# Patient Record
Sex: Female | Born: 1975 | Race: White | Hispanic: No | Marital: Married | Smoking: Never smoker
Health system: Southern US, Community
[De-identification: ages and names within clinical notes are randomized; demographics above are authoritative.]

## PROBLEM LIST (undated history)

## (undated) DIAGNOSIS — F32A Depression, unspecified: Secondary | ICD-10-CM

## (undated) DIAGNOSIS — N83209 Unspecified ovarian cyst, unspecified side: Secondary | ICD-10-CM

## (undated) DIAGNOSIS — N200 Calculus of kidney: Secondary | ICD-10-CM

## (undated) DIAGNOSIS — N3090 Cystitis, unspecified without hematuria: Secondary | ICD-10-CM

## (undated) DIAGNOSIS — I341 Nonrheumatic mitral (valve) prolapse: Secondary | ICD-10-CM

## (undated) DIAGNOSIS — F329 Major depressive disorder, single episode, unspecified: Secondary | ICD-10-CM

## (undated) DIAGNOSIS — N159 Renal tubulo-interstitial disease, unspecified: Secondary | ICD-10-CM

## (undated) DIAGNOSIS — F419 Anxiety disorder, unspecified: Secondary | ICD-10-CM

## (undated) HISTORY — PX: TUBAL LIGATION: SHX77

## (undated) HISTORY — PX: KNEE SURGERY: SHX244

## (undated) HISTORY — PX: ABDOMINAL SURGERY: SHX537

## (undated) HISTORY — PX: CHOLECYSTECTOMY: SHX55

---

## 2005-05-31 ENCOUNTER — Observation Stay: Payer: Self-pay

## 2005-06-09 ENCOUNTER — Observation Stay: Payer: Self-pay | Admitting: Obstetrics & Gynecology

## 2005-06-09 ENCOUNTER — Emergency Department: Payer: Self-pay | Admitting: Emergency Medicine

## 2005-06-20 ENCOUNTER — Observation Stay: Payer: Self-pay

## 2005-06-29 ENCOUNTER — Observation Stay: Payer: Self-pay | Admitting: Obstetrics & Gynecology

## 2005-07-04 ENCOUNTER — Observation Stay: Payer: Self-pay

## 2005-07-05 ENCOUNTER — Inpatient Hospital Stay: Payer: Self-pay | Admitting: Obstetrics & Gynecology

## 2005-07-12 ENCOUNTER — Observation Stay: Payer: Self-pay

## 2005-07-18 ENCOUNTER — Inpatient Hospital Stay: Payer: Self-pay

## 2005-09-04 ENCOUNTER — Emergency Department: Payer: Self-pay | Admitting: Emergency Medicine

## 2005-09-15 ENCOUNTER — Emergency Department: Payer: Self-pay | Admitting: Emergency Medicine

## 2005-11-02 ENCOUNTER — Ambulatory Visit: Payer: Self-pay | Admitting: Unknown Physician Specialty

## 2005-11-03 ENCOUNTER — Emergency Department: Payer: Self-pay | Admitting: Emergency Medicine

## 2006-01-28 ENCOUNTER — Emergency Department: Payer: Self-pay | Admitting: Emergency Medicine

## 2006-09-23 ENCOUNTER — Emergency Department: Payer: Self-pay | Admitting: Emergency Medicine

## 2006-09-24 ENCOUNTER — Ambulatory Visit: Payer: Self-pay | Admitting: Obstetrics & Gynecology

## 2006-10-26 ENCOUNTER — Emergency Department: Payer: Self-pay | Admitting: Emergency Medicine

## 2006-11-04 ENCOUNTER — Emergency Department: Payer: Self-pay | Admitting: Emergency Medicine

## 2007-02-04 ENCOUNTER — Emergency Department: Payer: Self-pay | Admitting: Emergency Medicine

## 2007-02-08 ENCOUNTER — Emergency Department: Payer: Self-pay | Admitting: Emergency Medicine

## 2007-04-04 ENCOUNTER — Emergency Department: Payer: Self-pay | Admitting: Emergency Medicine

## 2007-04-19 ENCOUNTER — Emergency Department: Payer: Self-pay | Admitting: Emergency Medicine

## 2007-05-07 ENCOUNTER — Emergency Department: Payer: Self-pay | Admitting: Emergency Medicine

## 2007-05-21 ENCOUNTER — Inpatient Hospital Stay (HOSPITAL_COMMUNITY): Admission: AD | Admit: 2007-05-21 | Discharge: 2007-05-22 | Payer: Self-pay | Admitting: Gynecology

## 2007-06-01 ENCOUNTER — Emergency Department: Payer: Self-pay | Admitting: Internal Medicine

## 2007-07-09 ENCOUNTER — Emergency Department: Payer: Self-pay | Admitting: Unknown Physician Specialty

## 2007-09-28 ENCOUNTER — Inpatient Hospital Stay (HOSPITAL_COMMUNITY): Admission: AD | Admit: 2007-09-28 | Discharge: 2007-09-28 | Payer: Self-pay | Admitting: Obstetrics and Gynecology

## 2007-09-28 ENCOUNTER — Ambulatory Visit: Payer: Self-pay | Admitting: Obstetrics and Gynecology

## 2007-10-06 ENCOUNTER — Inpatient Hospital Stay (HOSPITAL_COMMUNITY): Admission: AD | Admit: 2007-10-06 | Discharge: 2007-10-06 | Payer: Self-pay | Admitting: Gynecology

## 2007-10-06 ENCOUNTER — Ambulatory Visit: Payer: Self-pay | Admitting: Obstetrics and Gynecology

## 2008-03-21 ENCOUNTER — Emergency Department: Payer: Self-pay | Admitting: Emergency Medicine

## 2009-01-05 IMAGING — US ABDOMEN ULTRASOUND
1 series · 14 of 25 positions shown · non-contrast
Comparison: none

REASON FOR EXAM: Pregnant, RUQ and RLQ pain
COMMENTS:

[Series 1: abdomen ultrasound · 0.35mm/px · 14 of 46 slices shown]
[im 1/46]
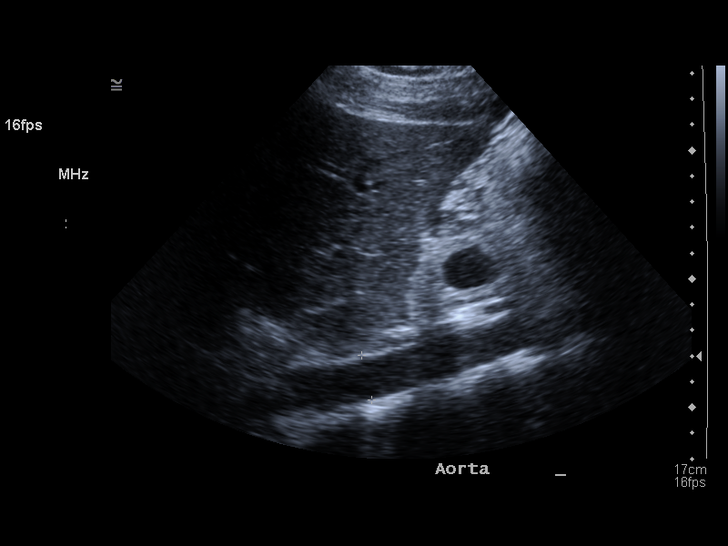
[im 4/46]
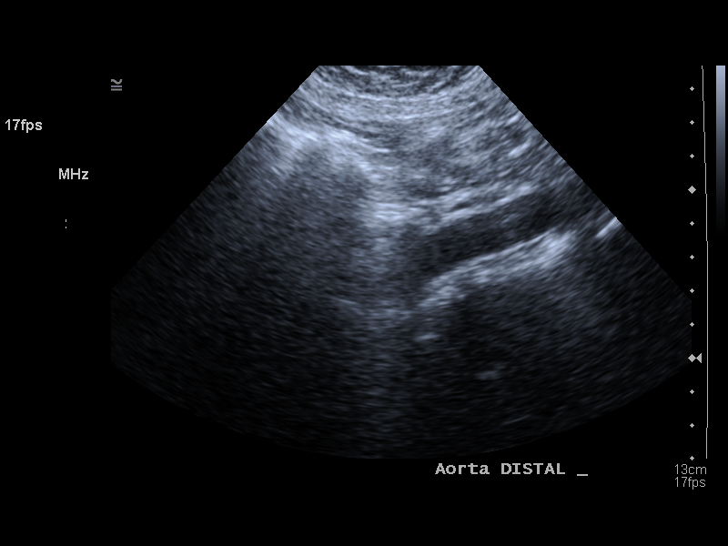
[im 8/46]
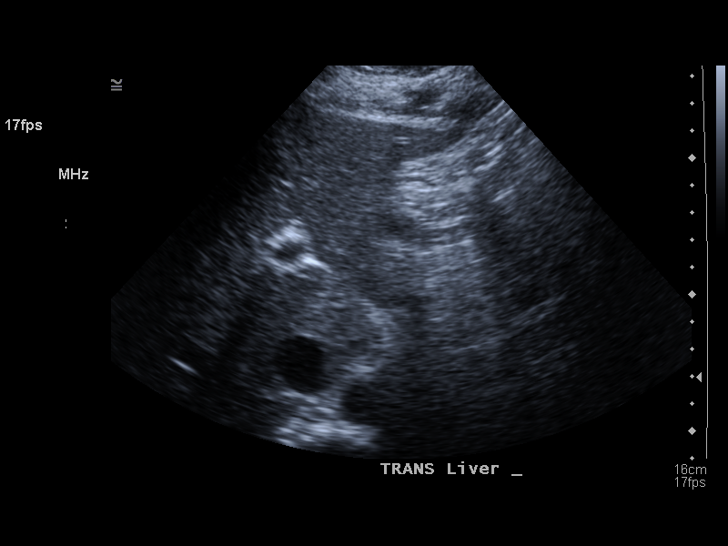
[im 12/46]
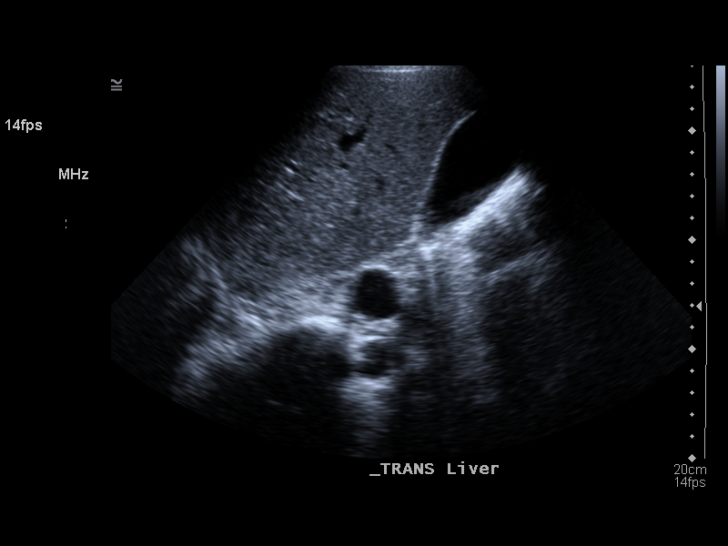
[im 16/46]
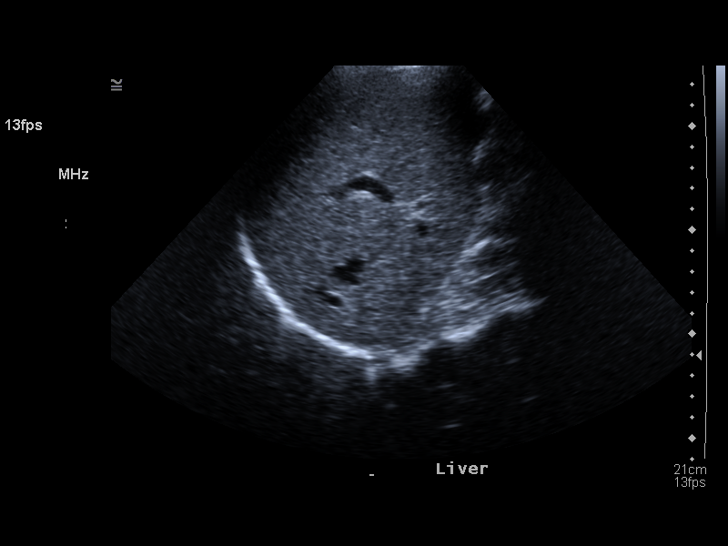
[im 17/46]
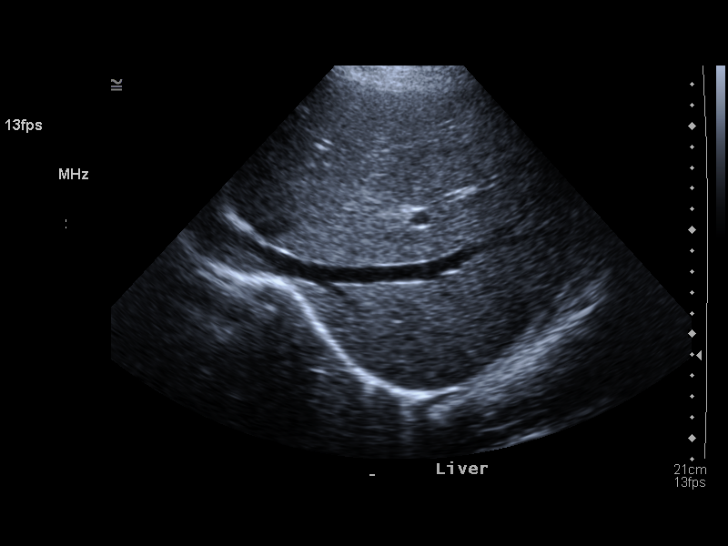
[im 21/46]
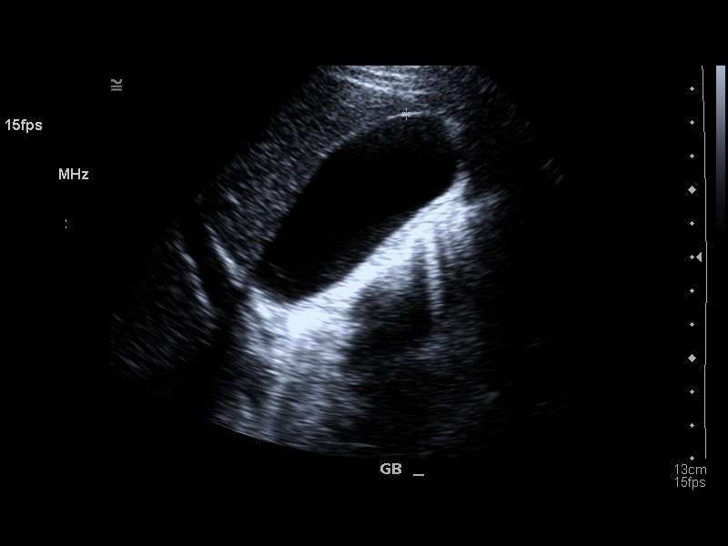
[im 25/46]
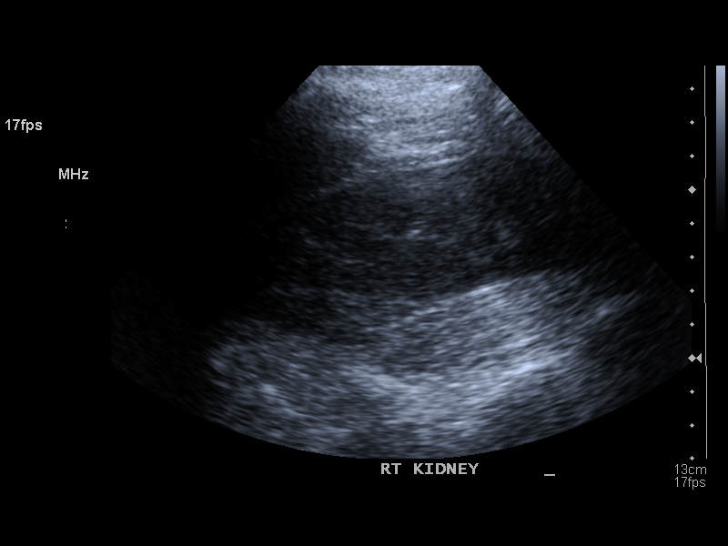
[im 29/46]
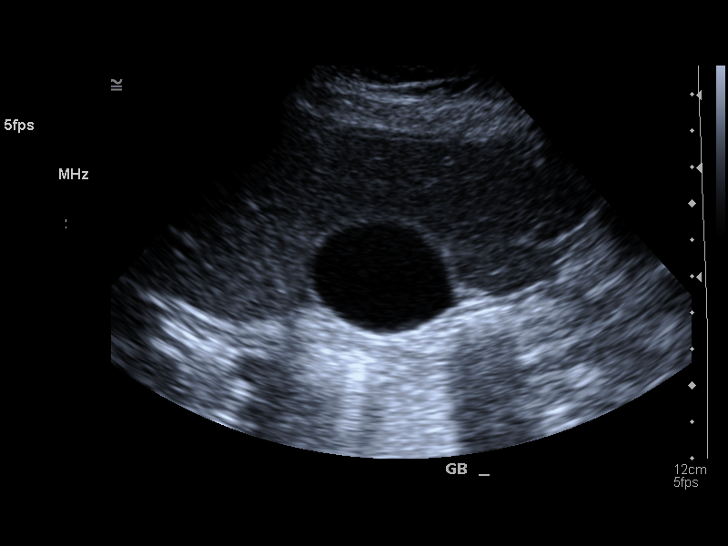
[im 31/46]
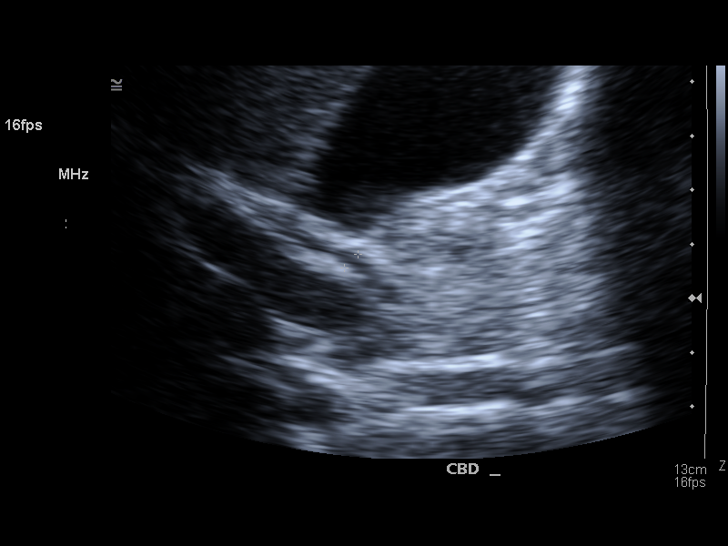
[im 34/46]
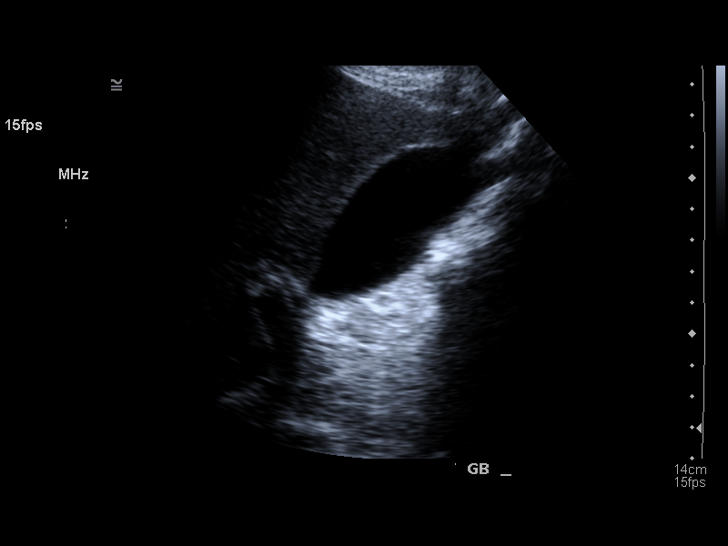
[im 38/46]
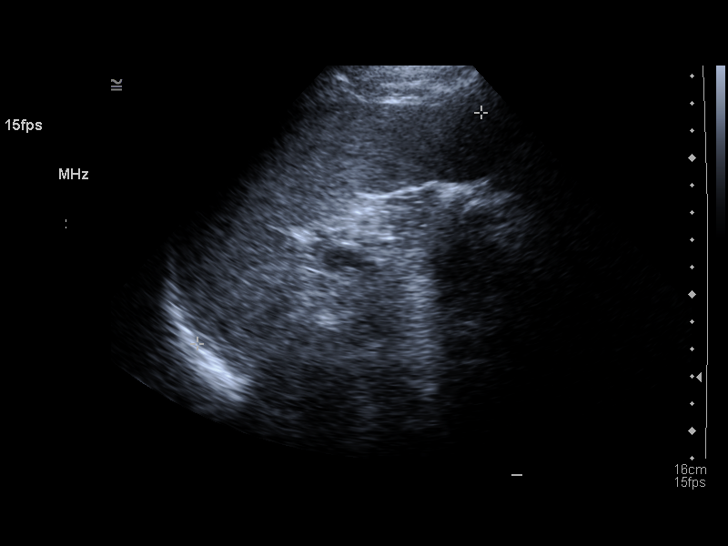
[im 42/46]
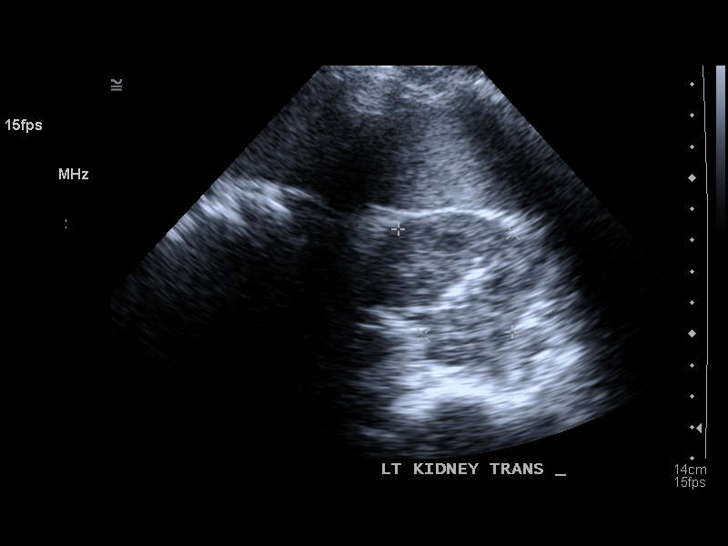
[im 46/46]
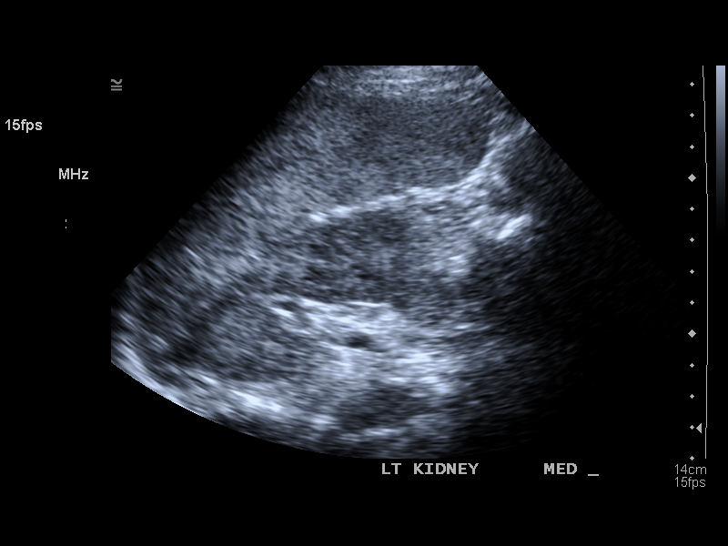

[14 of 25 positions shown; findings below may reference images not displayed]

PROCEDURE:     US  - US ABDOMEN GENERAL SURVEY  - July 09, 2007 [DATE]

RESULT:     The liver and abdominal aorta is normal in appearance. The
spleen is mildly enlarged. The spleen measures 13.4 cm in AP diameter. The
pancreas is not visualized adequately for evaluation.  No gallstones are
seen. There is no thickening of the gallbladder wall. The common bile duct
measures 3.9 mm in diameter. The kidneys show no hydronephrosis. No ascites
is seen. There is noted a possible trace, RIGHT pleural effusion.
IMPRESSION: 1.  No gallstones or other acute change is identified.
2.  No hydronephrosis is seen.
3.  Possible trace, RIGHT pleural effusion.

## 2009-01-05 IMAGING — US US OB US >=[ID] SNGL FETUS
1 series · 14 of 28 positions shown · non-contrast
Comparison: none

REASON FOR EXAM: Abdominal pain, spotting earlier
COMMENTS:

[Series 1: us ob us >=(id) sngl fetus · 0.27mm/px · 14 of 44 slices shown]
[im 2/44]
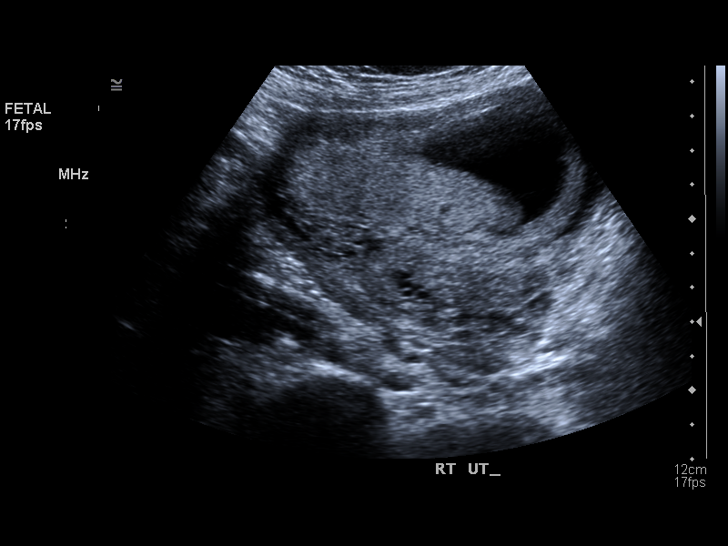
[im 5/44]
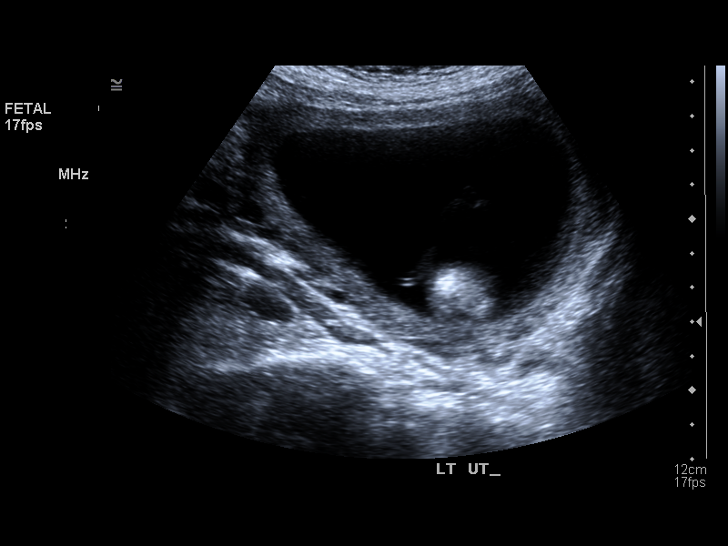
[im 8/44]
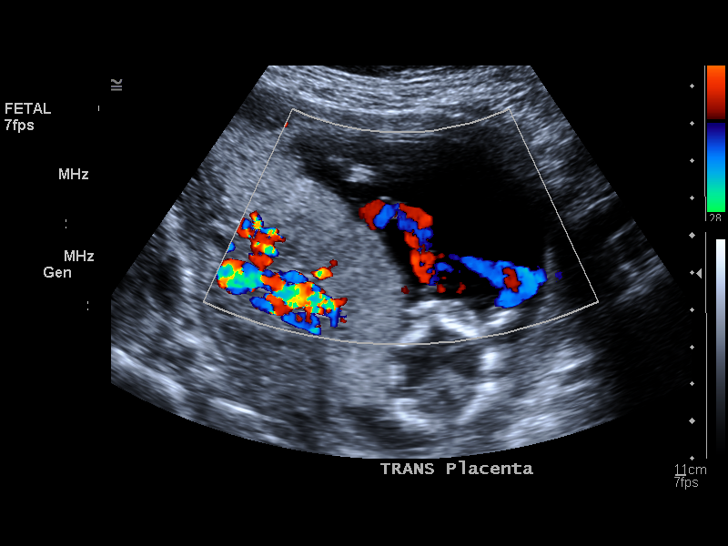
[im 12/44]
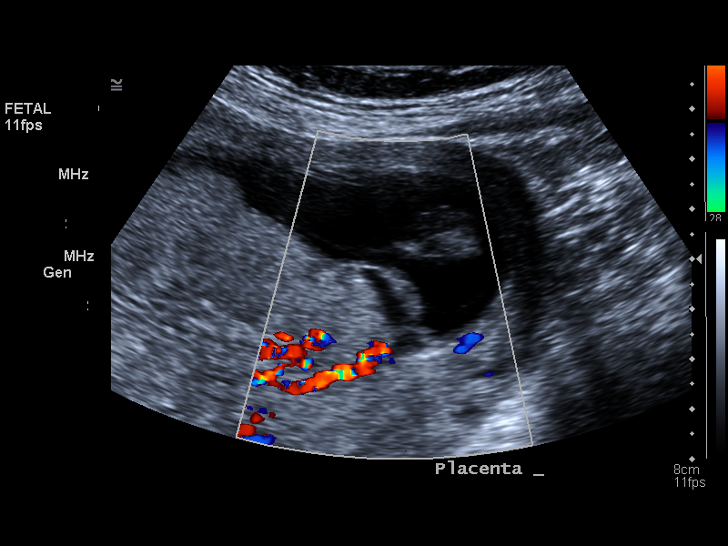
[im 15/44]
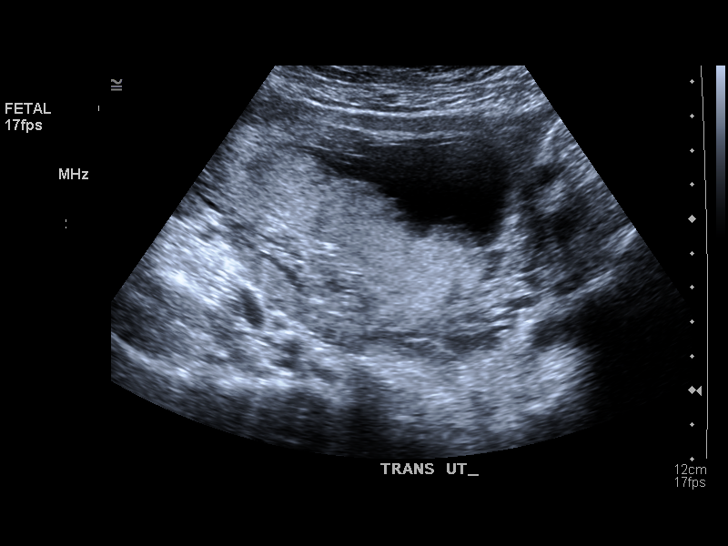
[im 18/44]
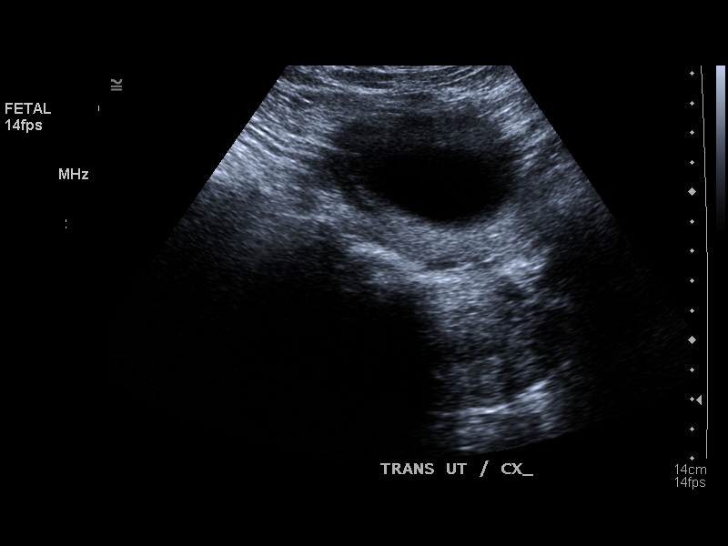
[im 21/44]
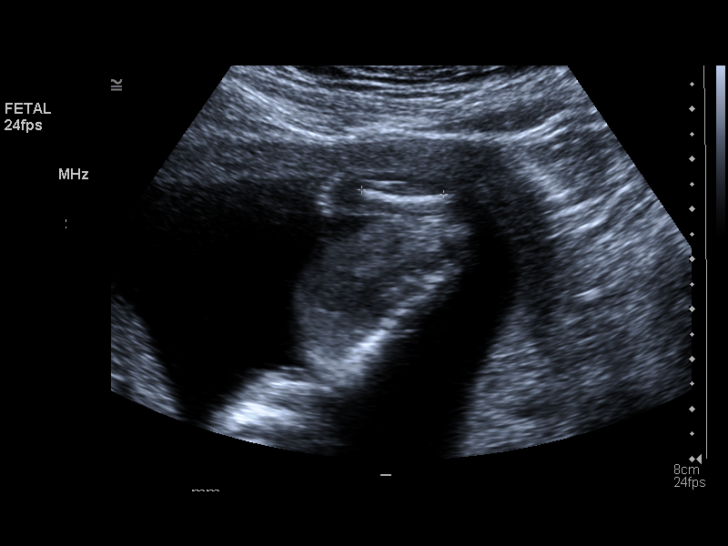
[im 24/44]
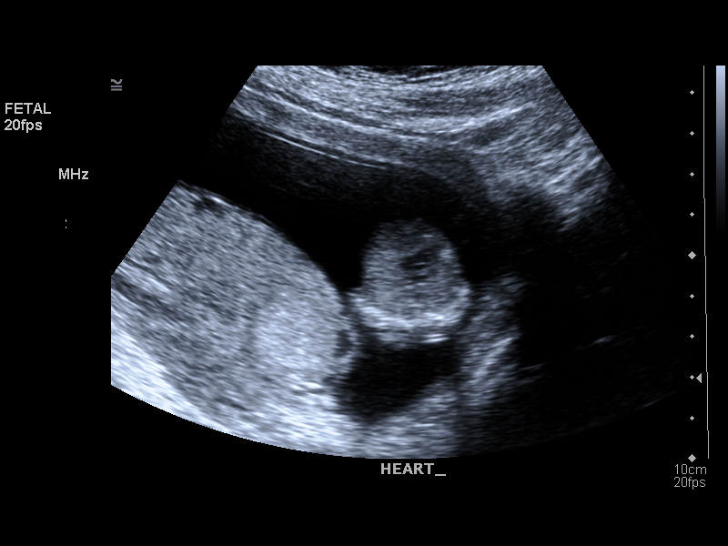
[im 28/44]
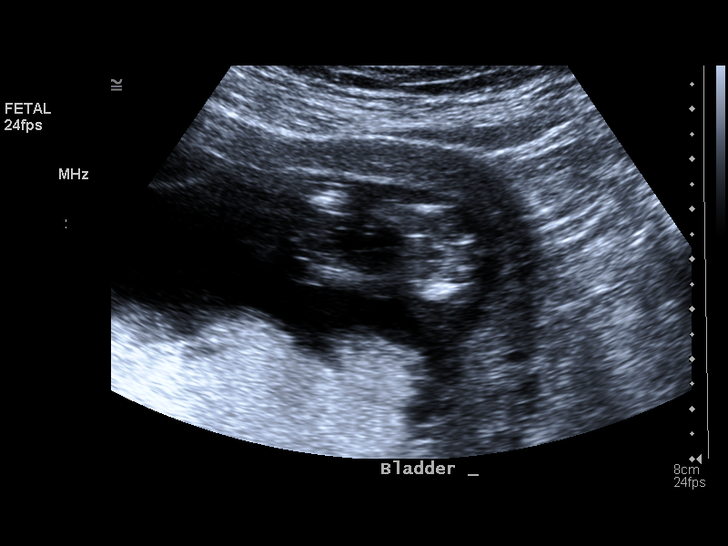
[im 31/44]
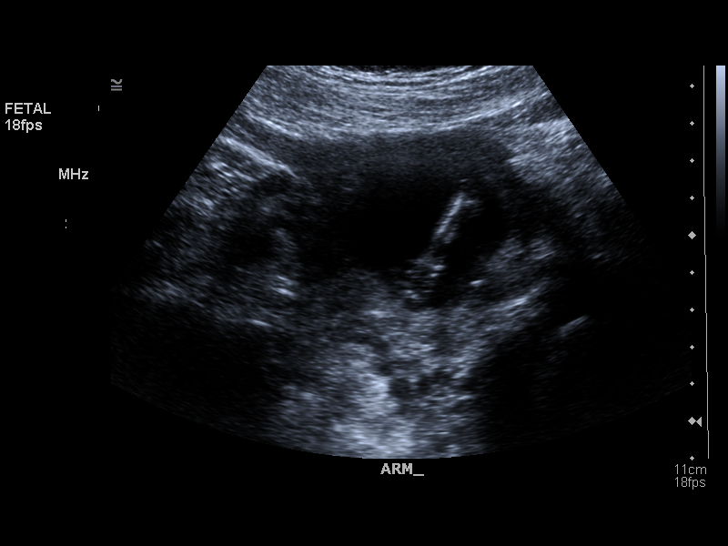
[im 34/44]
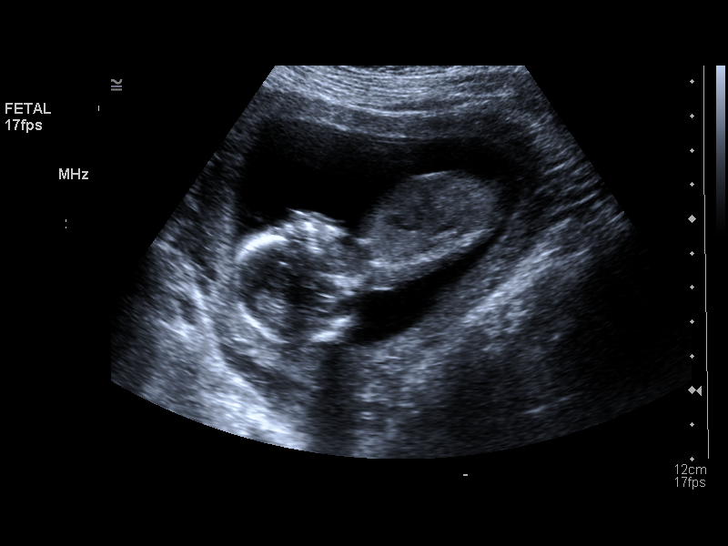
[im 37/44]
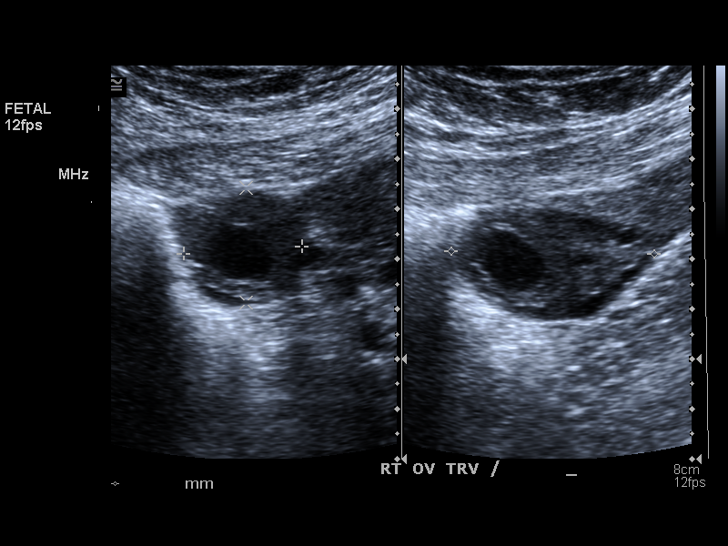
[im 40/44]
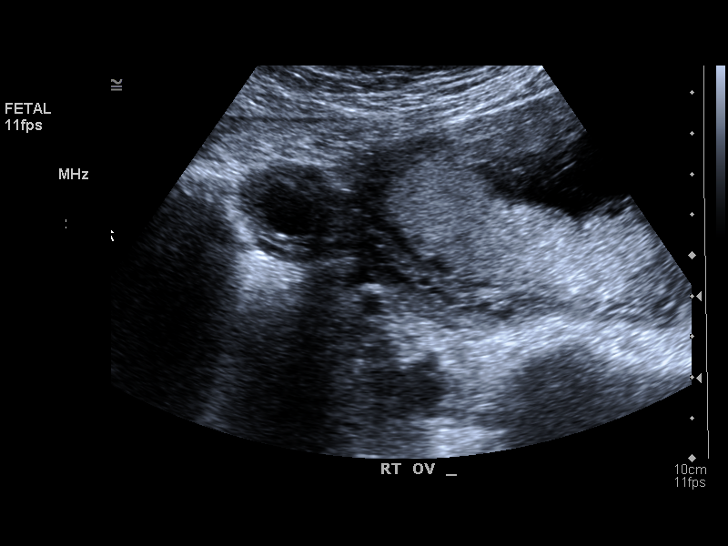
[im 44/44]
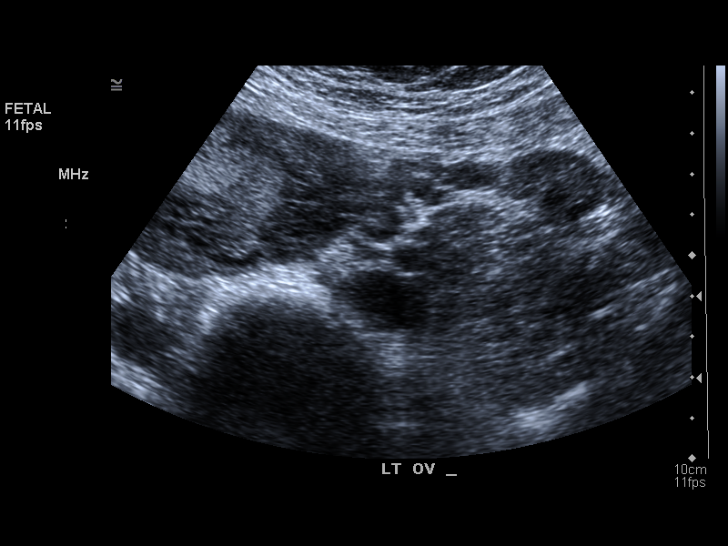

[14 of 28 positions shown; findings below may reference images not displayed]

PROCEDURE:     US  - US OB GREATER/OR EQUAL TO AA4IG  - July 09, 2007 [DATE]

RESULT:     There is single, living intrauterine gestation. The fetal heart
rate was monitored at 144 beats per minute. The placenta is posterior and to
the RIGHT.  A tiny, subchorionic bleed is noted at the inferior tip of the
placenta.  Amniotic fluid volume appears normal. The fetal anatomy is too
small to evaluate sonographically at this time.

Fetal measurements are as follows:

     FL:  16.2 mm (14 weeks, 5 days)
    CRL:   87.0 mm (14 weeks, 4 days)

Average ultrasound age is 14 weeks, 5 days. Ultrasound EDD is 01/02/2008.
IMPRESSION: Please see above.

## 2009-02-16 ENCOUNTER — Ambulatory Visit: Payer: Self-pay | Admitting: Otolaryngology

## 2009-02-18 ENCOUNTER — Emergency Department: Payer: Self-pay | Admitting: Unknown Physician Specialty

## 2009-02-26 ENCOUNTER — Ambulatory Visit: Payer: Self-pay | Admitting: Internal Medicine

## 2009-05-23 ENCOUNTER — Emergency Department: Payer: Self-pay | Admitting: Emergency Medicine

## 2009-07-24 ENCOUNTER — Emergency Department: Payer: Self-pay | Admitting: Unknown Physician Specialty

## 2009-10-18 ENCOUNTER — Emergency Department: Payer: Self-pay | Admitting: Emergency Medicine

## 2009-11-06 ENCOUNTER — Emergency Department: Payer: Self-pay | Admitting: Emergency Medicine

## 2009-11-28 ENCOUNTER — Emergency Department: Payer: Self-pay | Admitting: Emergency Medicine

## 2010-05-16 ENCOUNTER — Emergency Department: Payer: Self-pay | Admitting: Emergency Medicine

## 2011-01-21 IMAGING — CT CT ABD-PELV W/ CM
1 of 2 series · 15 of 32 positions shown, 19 images · IV contrast (isovue)
Comparison: None

REASON FOR EXAM: (1) RLQ abd pain; (2) RLQ pain
COMMENTS:   LMP: neg U-HCG today

PROCEDURE:     CT  - CT ABDOMEN / PELVIS  W  - July 24, 2009  [DATE]
RESULT:     History: Right lower quadrant pain
TECHNIQUE: Multiple axial images of the abdomen and pelvis were performed
from the lung bases to the pubic symphysis, with p.o. contrast and with 100
ml of Isovue 370 intravenous contrast.

[Series 2: appendicitis · axial · 0.72mm/px · z∈[-842,-398]mm · 15 of 162 slices shown, 19 images]
[im 7/162  soft-tissue]
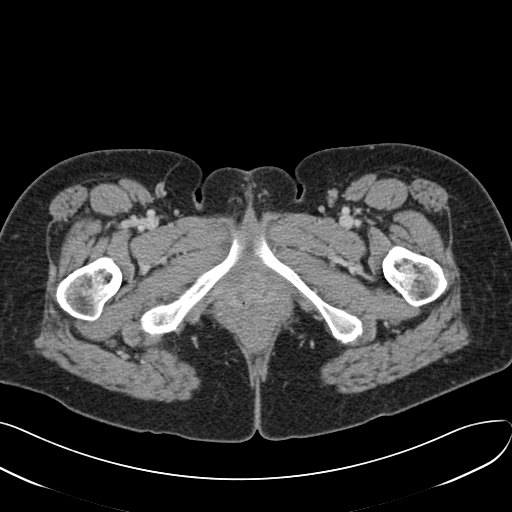
[im 7/162  bone]
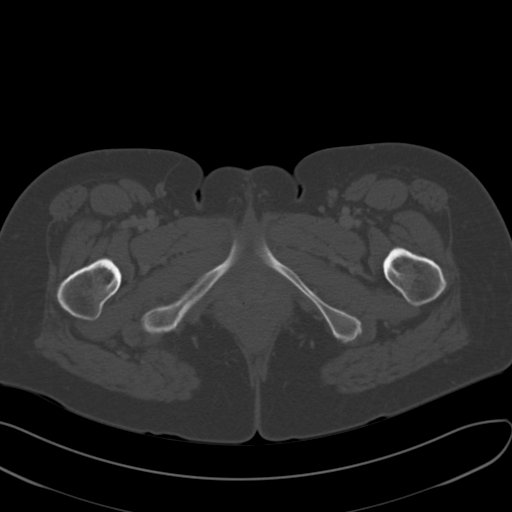
[im 20/162  soft-tissue]
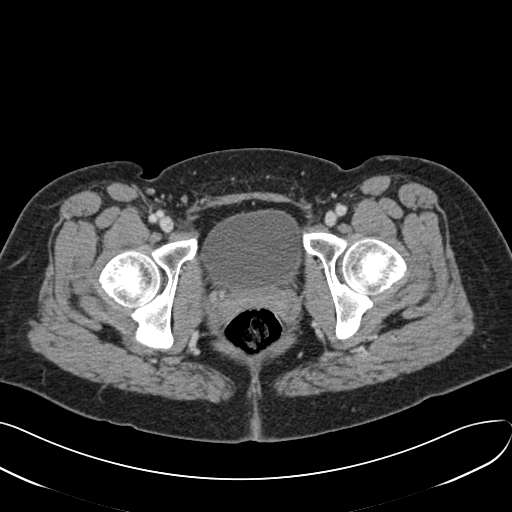
[im 33/162  soft-tissue]
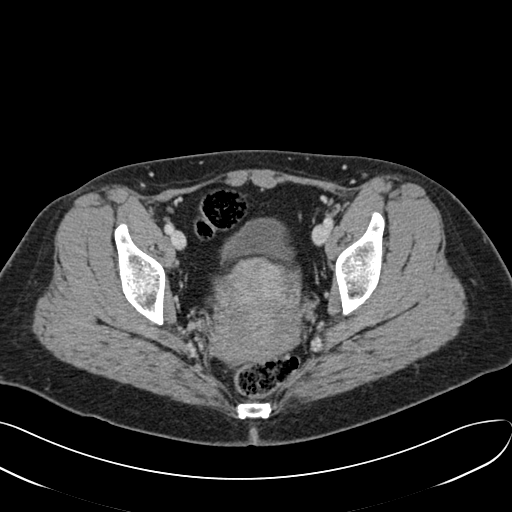
[im 46/162  soft-tissue]
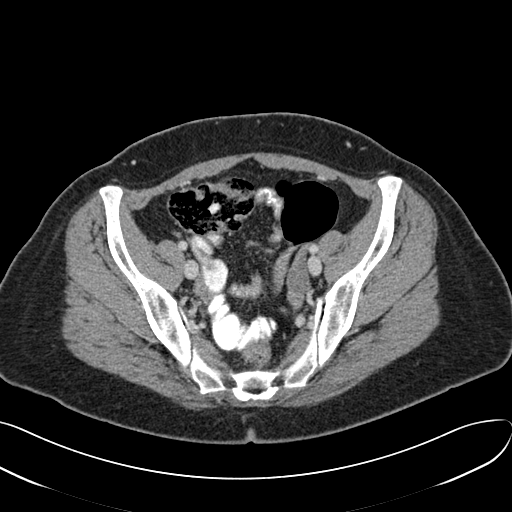
[im 58/162  soft-tissue]
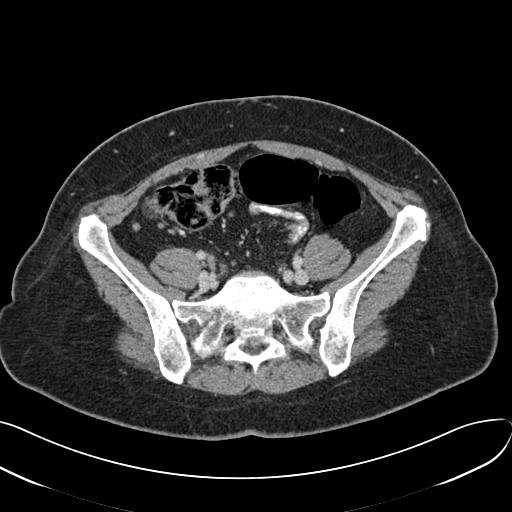
[im 71/162  soft-tissue]
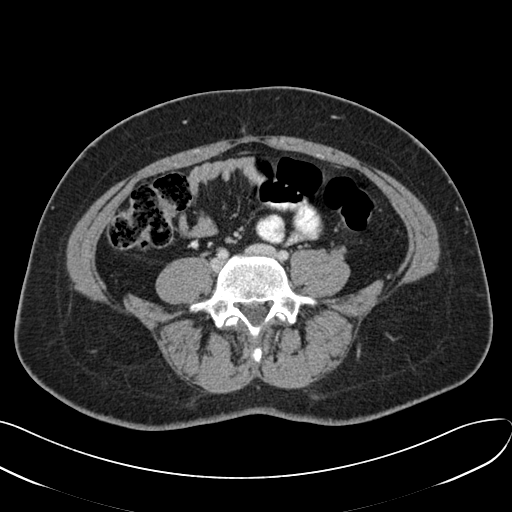
[im 84/162  soft-tissue]
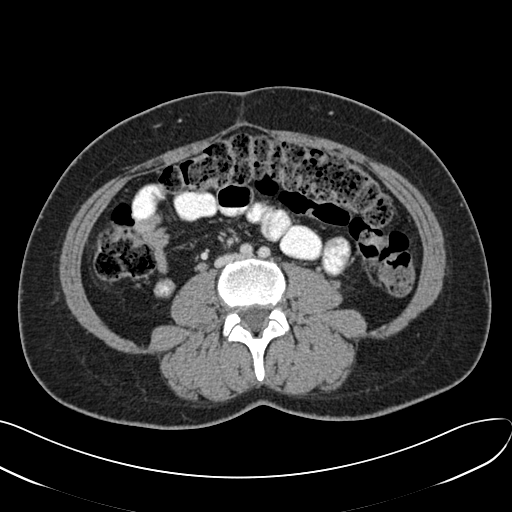
[im 91/162  soft-tissue]
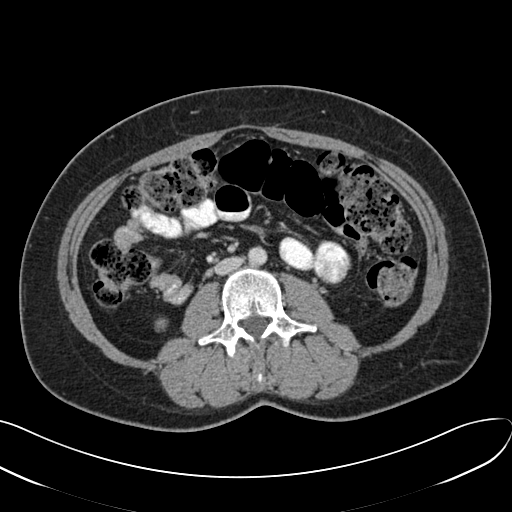
[im 104/162  soft-tissue]
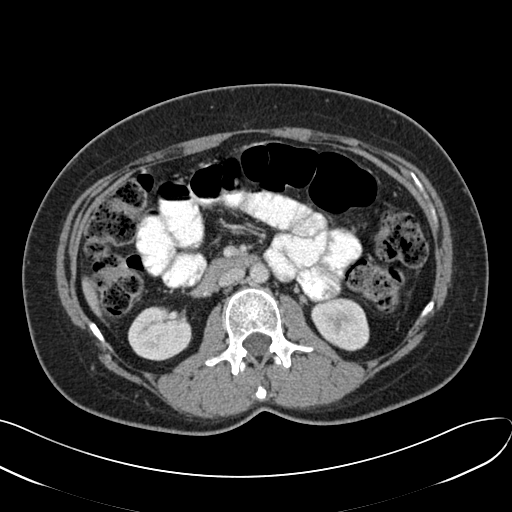
[im 104/162  bone]
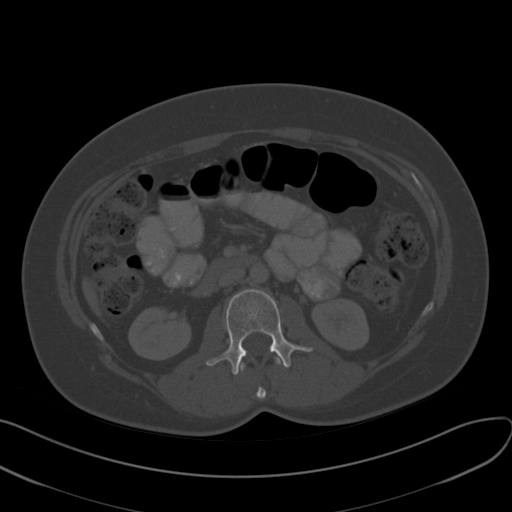
[im 116/162  soft-tissue]
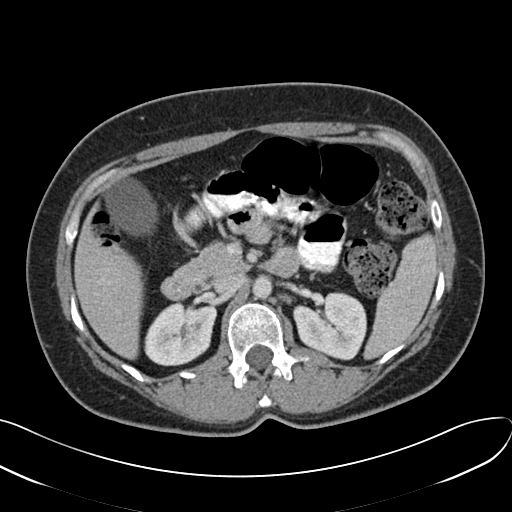
[im 129/162  soft-tissue]
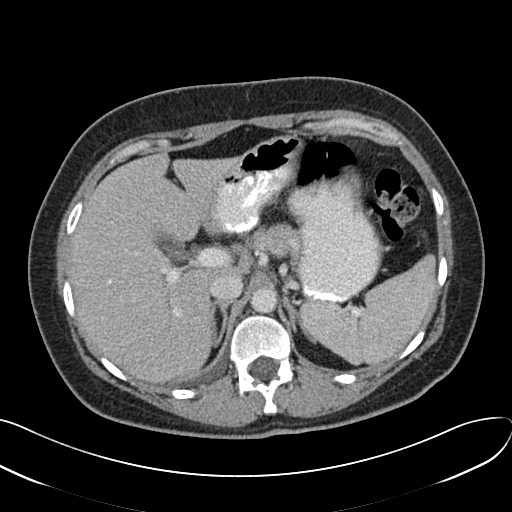
[im 136/162  lung]
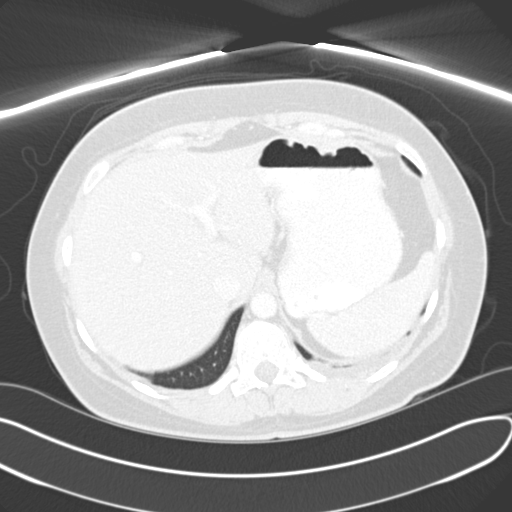
[im 142/162  soft-tissue]
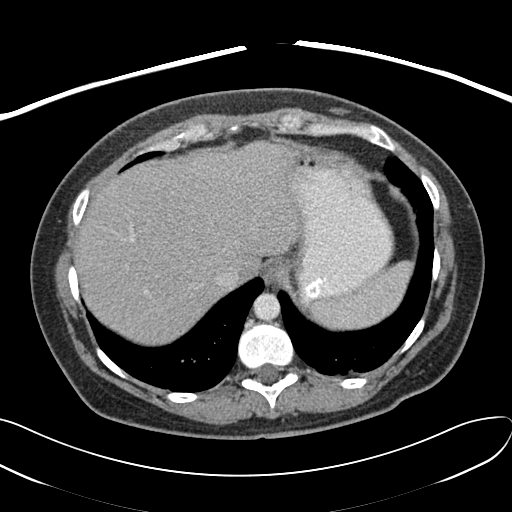
[im 142/162  lung]
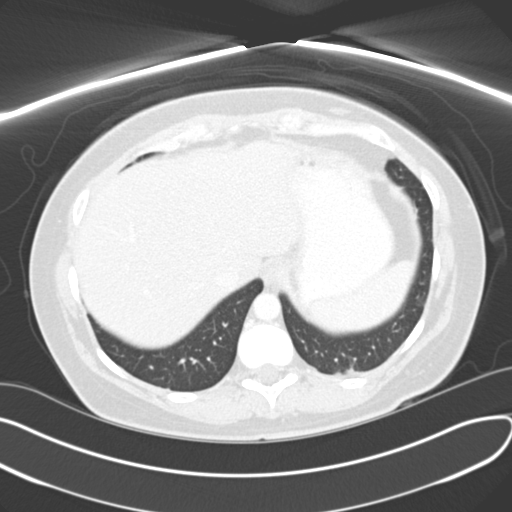
[im 149/162  lung]
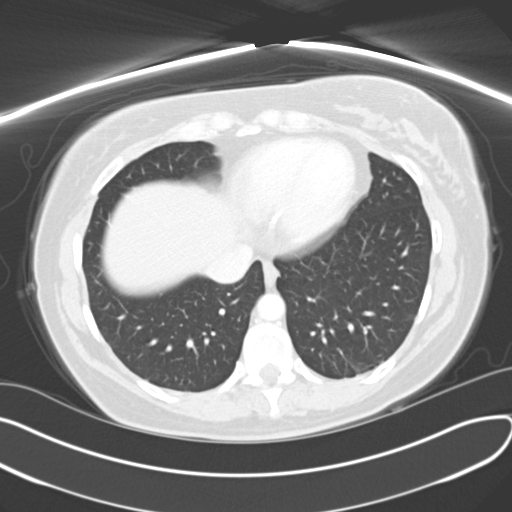
[im 155/162  soft-tissue]
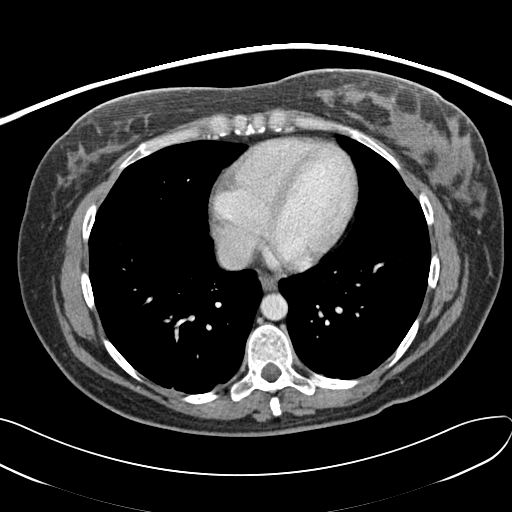
[im 155/162  lung]
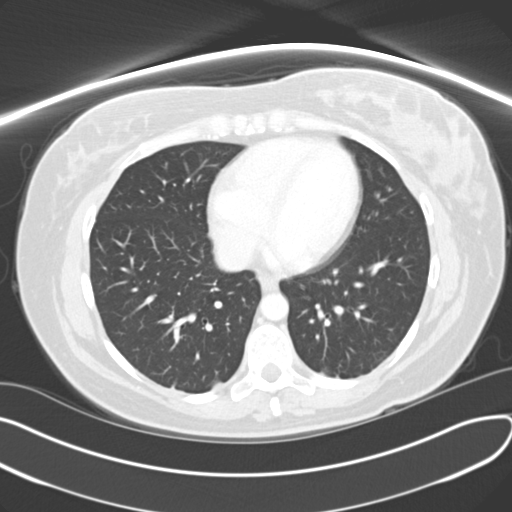

[15 of 32 positions shown; findings below may reference images not displayed]

FINDINGS: The lung bases are clear. There is no pneumothorax. The heart size is
normal.

The liver demonstrates no focal abnormality. There is no intrahepatic or
extrahepatic biliary ductal dilatation. The gallbladder is unremarkable. The
spleen demonstrates no focal abnormality. The kidneys, adrenal glands, and
pancreas are normal. The bladder is unremarkable.

The stomach, duodenum, small intestine, and large intestine demonstrate no
contrast extravasation or dilatation. There is a normal caliber appendix in
the right lower quadrant without periappendiceal inflammatory changes. There
is a moderate amount of stool throughout the colon. There is no
pneumoperitoneum, pneumatosis, or portal venous gas. There is a trace amount
of pelvic free fluid. There is a right ovarian cystic structure measuring
2.7 x 1.8 cm likely representing an ovarian cyst. There is a T type IUD
noted. There is no lymphadenopathy.

The abdominal aorta is normal in caliber with atherosclerosis.

The osseous structures are unremarkable.
IMPRESSION: Normal appendix.

There is a right ovarian cystic structure measuring 2.7 x 1.8 cm likely
representing an ovarian cyst.

There is a T type IUD noted.

## 2011-05-31 ENCOUNTER — Emergency Department: Payer: Self-pay | Admitting: Emergency Medicine

## 2011-06-14 LAB — URINALYSIS, ROUTINE W REFLEX MICROSCOPIC
Glucose, UA: NEGATIVE
Glucose, UA: NEGATIVE
Hgb urine dipstick: NEGATIVE
Hgb urine dipstick: NEGATIVE
Ketones, ur: NEGATIVE
Protein, ur: NEGATIVE
Protein, ur: NEGATIVE
Specific Gravity, Urine: 1.02
Urobilinogen, UA: 1
pH: 6.5

## 2011-06-14 LAB — URINE MICROSCOPIC-ADD ON

## 2011-06-14 LAB — FETAL FIBRONECTIN
Fetal Fibronectin: NEGATIVE
Fetal Fibronectin: NEGATIVE

## 2011-07-06 LAB — URINALYSIS, ROUTINE W REFLEX MICROSCOPIC
Ketones, ur: NEGATIVE
Nitrite: NEGATIVE
Protein, ur: NEGATIVE
Urobilinogen, UA: 0.2

## 2011-07-06 LAB — GC/CHLAMYDIA PROBE AMP, GENITAL: GC Probe Amp, Genital: NEGATIVE

## 2011-07-06 LAB — CBC
HCT: 38.1
Hemoglobin: 13.4
RBC: 4.49
RDW: 13
WBC: 6.5

## 2011-07-06 LAB — ABO/RH
ABO/RH(D): O NEG
Weak D: NEGATIVE

## 2011-07-06 LAB — WET PREP, GENITAL: Trich, Wet Prep: NONE SEEN

## 2011-07-06 LAB — POCT PREGNANCY, URINE
Operator id: 114931
Preg Test, Ur: POSITIVE

## 2011-07-23 ENCOUNTER — Emergency Department (HOSPITAL_COMMUNITY)
Admission: EM | Admit: 2011-07-23 | Discharge: 2011-07-24 | Disposition: A | Discharge status: Home / Self Care | Payer: Medicaid Other | Attending: Emergency Medicine | Admitting: Emergency Medicine

## 2011-07-23 ENCOUNTER — Emergency Department (HOSPITAL_COMMUNITY): Payer: Medicaid Other

## 2011-07-23 DIAGNOSIS — I059 Rheumatic mitral valve disease, unspecified: Secondary | ICD-10-CM | POA: Insufficient documentation

## 2011-07-23 DIAGNOSIS — N739 Female pelvic inflammatory disease, unspecified: Secondary | ICD-10-CM | POA: Insufficient documentation

## 2011-07-23 DIAGNOSIS — J069 Acute upper respiratory infection, unspecified: Secondary | ICD-10-CM | POA: Insufficient documentation

## 2011-07-23 DIAGNOSIS — R1031 Right lower quadrant pain: Secondary | ICD-10-CM | POA: Insufficient documentation

## 2011-07-23 DIAGNOSIS — Z79899 Other long term (current) drug therapy: Secondary | ICD-10-CM | POA: Insufficient documentation

## 2011-07-23 DIAGNOSIS — N83209 Unspecified ovarian cyst, unspecified side: Secondary | ICD-10-CM | POA: Insufficient documentation

## 2011-07-23 LAB — CBC
HCT: 41.8 % (ref 36.0–46.0)
MCHC: 34.2 g/dL (ref 30.0–36.0)
MCV: 83.9 fL (ref 78.0–100.0)
Platelets: 232 10*3/uL (ref 150–400)
RDW: 12.5 % (ref 11.5–15.5)

## 2011-07-23 LAB — URINALYSIS, ROUTINE W REFLEX MICROSCOPIC
Glucose, UA: NEGATIVE mg/dL
Hgb urine dipstick: NEGATIVE
Leukocytes, UA: NEGATIVE
Protein, ur: NEGATIVE mg/dL
Specific Gravity, Urine: 1.004 — ABNORMAL LOW (ref 1.005–1.030)

## 2011-07-23 LAB — WET PREP, GENITAL: Yeast Wet Prep HPF POC: NONE SEEN

## 2011-07-23 LAB — BASIC METABOLIC PANEL
BUN: 5 mg/dL — ABNORMAL LOW (ref 6–23)
Calcium: 9.8 mg/dL (ref 8.4–10.5)
Creatinine, Ser: 0.71 mg/dL (ref 0.50–1.10)
GFR calc Af Amer: >90 mL/min (ref >90)
GFR calc non Af Amer: >90 mL/min (ref >90)

## 2011-07-23 LAB — DIFFERENTIAL
Eosinophils Absolute: 0.1 10*3/uL (ref 0.0–0.7)
Eosinophils Relative: 2 % (ref 0–5)
Lymphocytes Relative: 28 % (ref 12–46)
Lymphs Abs: 2.6 10*3/uL (ref 0.7–4.0)
Monocytes Absolute: 0.6 10*3/uL (ref 0.1–1.0)

## 2011-07-23 LAB — PREGNANCY, URINE: Preg Test, Ur: NEGATIVE

## 2011-07-24 ENCOUNTER — Encounter (HOSPITAL_COMMUNITY): Payer: Self-pay

## 2011-07-24 ENCOUNTER — Emergency Department (HOSPITAL_COMMUNITY): Payer: Medicaid Other

## 2011-07-24 MED ORDER — IOHEXOL 300 MG/ML  SOLN
100.0000 mL | Freq: Once | INTRAMUSCULAR | Status: AC | PRN
  Administered 2011-07-24: 100 mL via INTRAVENOUS

## 2012-01-12 ENCOUNTER — Emergency Department: Payer: Self-pay | Admitting: Internal Medicine

## 2012-01-12 LAB — PREGNANCY, URINE: Pregnancy Test, Urine: NEGATIVE m[IU]/mL

## 2012-02-02 ENCOUNTER — Emergency Department: Payer: Self-pay | Admitting: Emergency Medicine

## 2012-04-02 ENCOUNTER — Emergency Department: Payer: Self-pay | Admitting: Unknown Physician Specialty

## 2012-04-02 LAB — BASIC METABOLIC PANEL
Calcium, Total: 9.5 mg/dL (ref 8.5–10.1)
Chloride: 107 mmol/L (ref 98–107)
Co2: 23 mmol/L (ref 21–32)
EGFR (African American): >60
Glucose: 94 mg/dL (ref 65–99)
Osmolality: 279 (ref 275–301)
Potassium: 3.5 mmol/L (ref 3.5–5.1)
Sodium: 140 mmol/L (ref 136–145)

## 2012-04-02 LAB — CBC
HCT: 46.3 % (ref 35.0–47.0)
HGB: 15.3 g/dL (ref 12.0–16.0)
MCH: 28.6 pg (ref 26.0–34.0)
MCHC: 33 g/dL (ref 32.0–36.0)
MCV: 87 fL (ref 80–100)
RBC: 5.33 10*6/uL — ABNORMAL HIGH (ref 3.80–5.20)

## 2012-04-02 LAB — CK TOTAL AND CKMB (NOT AT ARMC): CK-MB: 0.6 ng/mL (ref 0.5–3.6)

## 2012-04-02 LAB — TROPONIN I: Troponin-I: <0.02 ng/mL

## 2012-04-03 LAB — HEPATIC FUNCTION PANEL A (ARMC)
Albumin: 4.4 g/dL (ref 3.4–5.0)
Alkaline Phosphatase: 72 U/L (ref 50–136)
Bilirubin, Direct: 0.1 mg/dL (ref 0.00–0.20)
SGOT(AST): 30 U/L (ref 15–37)
Total Protein: 8.8 g/dL — ABNORMAL HIGH (ref 6.4–8.2)

## 2012-04-07 ENCOUNTER — Emergency Department: Payer: Self-pay | Admitting: Emergency Medicine

## 2012-04-07 LAB — CBC
HCT: 41.6 % (ref 35.0–47.0)
MCV: 87 fL (ref 80–100)
RBC: 4.79 10*6/uL (ref 3.80–5.20)
RDW: 13.6 % (ref 11.5–14.5)
WBC: 8.4 10*3/uL (ref 3.6–11.0)

## 2012-04-07 LAB — URINALYSIS, COMPLETE
Blood: NEGATIVE
Glucose,UR: >=500 mg/dL (ref 0–75)
Leukocyte Esterase: NEGATIVE
Nitrite: NEGATIVE
Protein: NEGATIVE
RBC,UR: NONE SEEN /HPF (ref 0–5)
Specific Gravity: 1.002 (ref 1.003–1.030)

## 2012-04-07 LAB — PREGNANCY, URINE: Pregnancy Test, Urine: POSITIVE m[IU]/mL

## 2012-04-08 LAB — BASIC METABOLIC PANEL
Anion Gap: 9 (ref 7–16)
BUN: 8 mg/dL (ref 7–18)
Calcium, Total: 9.1 mg/dL (ref 8.5–10.1)
EGFR (Non-African Amer.): >60
Glucose: 121 mg/dL — ABNORMAL HIGH (ref 65–99)
Osmolality: 279 (ref 275–301)
Sodium: 140 mmol/L (ref 136–145)

## 2012-04-08 LAB — HCG, QUANTITATIVE, PREGNANCY: Beta Hcg, Quant.: 54671 m[IU]/mL — ABNORMAL HIGH

## 2013-07-11 IMAGING — CR RIGHT ANKLE - COMPLETE 3+ VIEW
1 series · 5 of 5 positions shown · non-contrast
Comparison: none

REASON FOR EXAM: pain
COMMENTS:   May transport without cardiac monitor

PROCEDURE:     DXR - DXR ANKLE RIGHT COMPLETE  - January 12, 2012  [DATE]
RESULT:     No acute bony or joint abnormality. Type bony density noted just
to the cuboid. This may be a site of old injury.

[Series 1: x ankle ap right · 0.14mm/px · 5 of 5 slices shown]
[im 1/5]
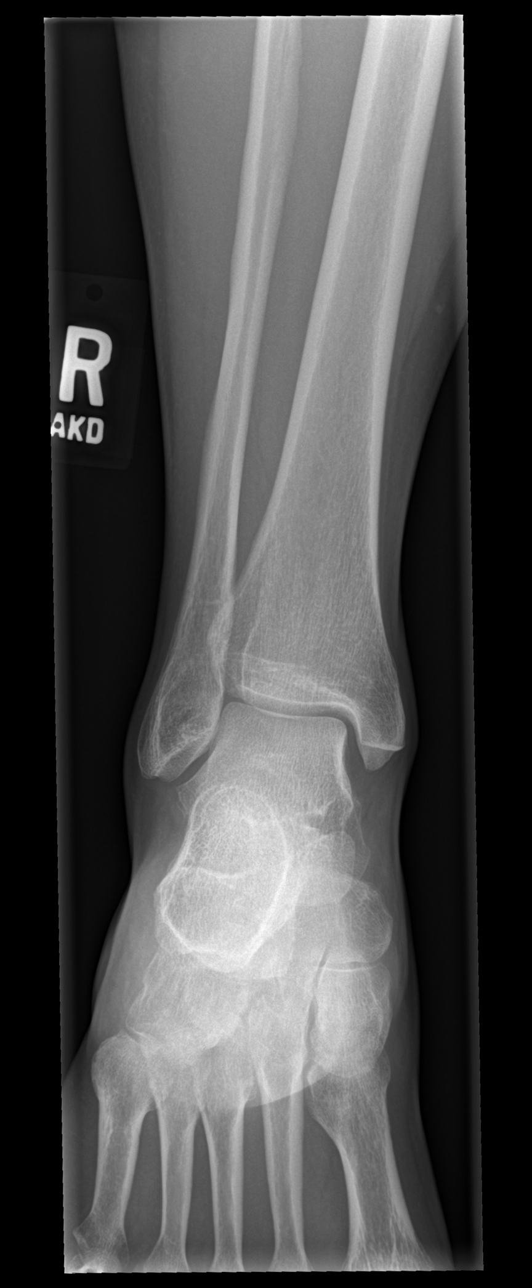
[im 2/5]
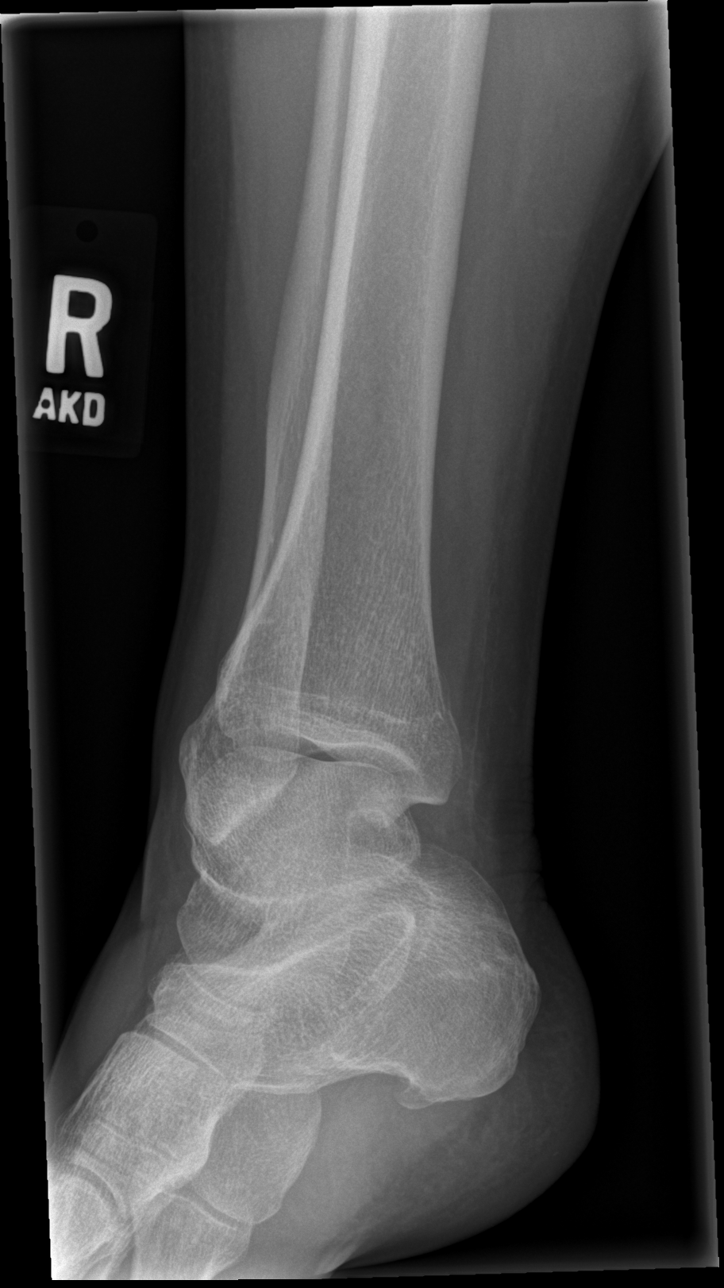
[im 3/5]
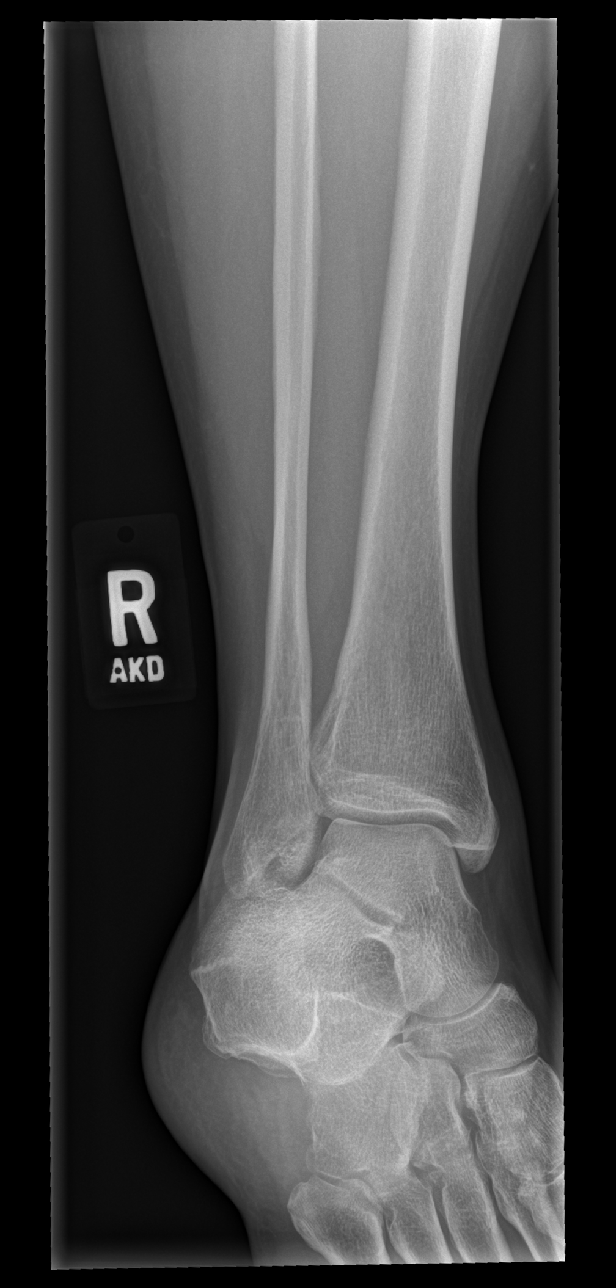
[im 4/5]
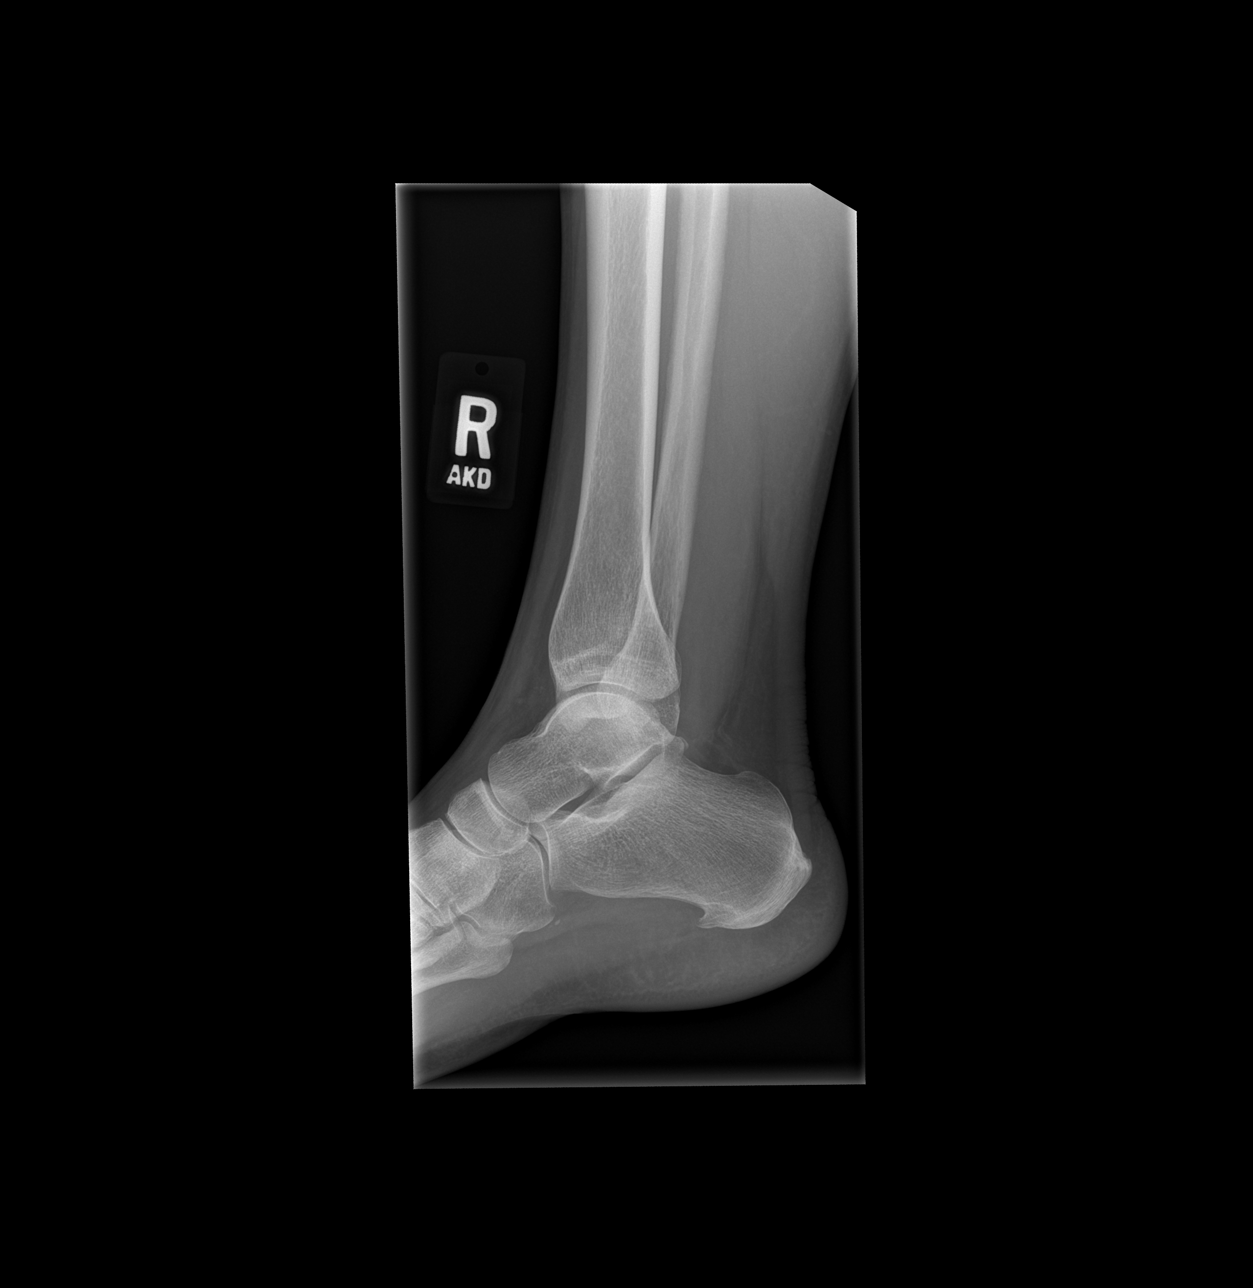
[im 5/5]
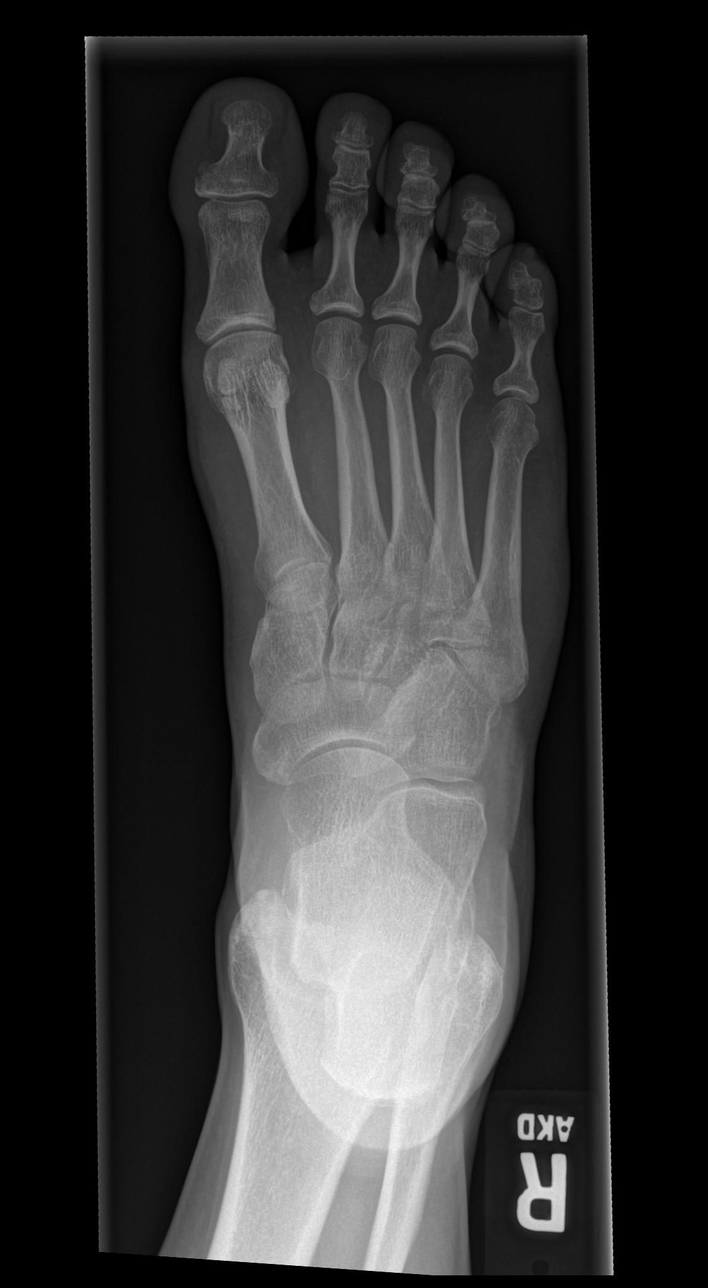

[5 of 5 positions shown; findings below may reference images not displayed]

IMPRESSION: No acute abnormality.

## 2014-06-04 ENCOUNTER — Emergency Department: Payer: Self-pay | Admitting: Emergency Medicine

## 2014-06-04 LAB — COMPREHENSIVE METABOLIC PANEL
ALK PHOS: 72 U/L
ANION GAP: 7 (ref 7–16)
AST: 15 U/L (ref 15–37)
Albumin: 3.8 g/dL (ref 3.4–5.0)
BUN: 7 mg/dL (ref 7–18)
Bilirubin,Total: 0.4 mg/dL (ref 0.2–1.0)
CHLORIDE: 108 mmol/L — AB (ref 98–107)
CO2: 25 mmol/L (ref 21–32)
Calcium, Total: 8.6 mg/dL (ref 8.5–10.1)
Creatinine: 0.89 mg/dL (ref 0.60–1.30)
EGFR (African American): >60
EGFR (Non-African Amer.): >60
Glucose: 116 mg/dL — ABNORMAL HIGH (ref 65–99)
Osmolality: 278 (ref 275–301)
Potassium: 3.6 mmol/L (ref 3.5–5.1)
SGPT (ALT): 14 U/L
Sodium: 140 mmol/L (ref 136–145)
Total Protein: 7.5 g/dL (ref 6.4–8.2)

## 2014-06-04 LAB — URINALYSIS, COMPLETE
BACTERIA: NONE SEEN
BILIRUBIN, UR: NEGATIVE
Blood: NEGATIVE
GLUCOSE, UR: NEGATIVE mg/dL (ref 0–75)
Ketone: NEGATIVE
Leukocyte Esterase: NEGATIVE
NITRITE: NEGATIVE
PH: 6 (ref 4.5–8.0)
Protein: NEGATIVE
Specific Gravity: 1.004 (ref 1.003–1.030)
Squamous Epithelial: 5

## 2014-06-04 LAB — CBC
HCT: 40.8 % (ref 35.0–47.0)
HGB: 13.7 g/dL (ref 12.0–16.0)
MCH: 28.7 pg (ref 26.0–34.0)
MCHC: 33.5 g/dL (ref 32.0–36.0)
MCV: 86 fL (ref 80–100)
Platelet: 206 10*3/uL (ref 150–440)
RBC: 4.76 10*6/uL (ref 3.80–5.20)
RDW: 13.5 % (ref 11.5–14.5)
WBC: 7.8 10*3/uL (ref 3.6–11.0)

## 2014-06-04 LAB — PREGNANCY, URINE: Pregnancy Test, Urine: NEGATIVE m[IU]/mL

## 2014-06-06 LAB — URINE CULTURE

## 2014-07-20 ENCOUNTER — Emergency Department: Payer: Self-pay | Admitting: Emergency Medicine

## 2014-10-29 ENCOUNTER — Emergency Department: Payer: Self-pay | Admitting: Emergency Medicine

## 2014-10-29 LAB — CBC
HCT: 40 % (ref 35.0–47.0)
HGB: 13.5 g/dL (ref 12.0–16.0)
MCH: 28.9 pg (ref 26.0–34.0)
MCHC: 33.9 g/dL (ref 32.0–36.0)
MCV: 85 fL (ref 80–100)
Platelet: 225 10*3/uL (ref 150–440)
RBC: 4.69 10*6/uL (ref 3.80–5.20)
RDW: 12.6 % (ref 11.5–14.5)
WBC: 8 10*3/uL (ref 3.6–11.0)

## 2014-10-29 LAB — BASIC METABOLIC PANEL
ANION GAP: 6 — AB (ref 7–16)
BUN: 9 mg/dL (ref 7–18)
CO2: 27 mmol/L (ref 21–32)
Calcium, Total: 8.3 mg/dL — ABNORMAL LOW (ref 8.5–10.1)
Chloride: 105 mmol/L (ref 98–107)
Creatinine: 0.88 mg/dL (ref 0.60–1.30)
EGFR (Non-African Amer.): >60
Glucose: 122 mg/dL — ABNORMAL HIGH (ref 65–99)
Osmolality: 276 (ref 275–301)
Potassium: 3.4 mmol/L — ABNORMAL LOW (ref 3.5–5.1)
Sodium: 138 mmol/L (ref 136–145)

## 2014-10-29 LAB — TROPONIN I: Troponin-I: <0.02 ng/mL

## 2014-10-30 ENCOUNTER — Emergency Department: Payer: Self-pay | Admitting: Emergency Medicine

## 2014-11-27 ENCOUNTER — Emergency Department: Payer: Self-pay | Admitting: Emergency Medicine

## 2015-01-09 ENCOUNTER — Emergency Department
Admit: 2015-01-09 | Disposition: A | Discharge status: Home / Self Care | Payer: Self-pay | Admitting: Emergency Medicine

## 2015-01-09 LAB — URINALYSIS, COMPLETE
BLOOD: NEGATIVE
Bilirubin,UR: NEGATIVE
Glucose,UR: NEGATIVE mg/dL (ref 0–75)
KETONE: NEGATIVE
Nitrite: NEGATIVE
Ph: 6 (ref 4.5–8.0)
Protein: NEGATIVE
SPECIFIC GRAVITY: 1.014 (ref 1.003–1.030)

## 2015-01-09 LAB — COMPREHENSIVE METABOLIC PANEL
ALK PHOS: 56 U/L
ALT: 15 U/L
AST: 17 U/L
Albumin: 4.3 g/dL
Anion Gap: 9 (ref 7–16)
BUN: 13 mg/dL
Bilirubin,Total: 0.5 mg/dL
CALCIUM: 9.4 mg/dL
CO2: 25 mmol/L
Chloride: 105 mmol/L
Creatinine: 0.8 mg/dL
EGFR (African American): >60
EGFR (Non-African Amer.): >60
Glucose: 112 mg/dL — ABNORMAL HIGH
POTASSIUM: 3.9 mmol/L
Sodium: 139 mmol/L
Total Protein: 7.8 g/dL

## 2015-01-09 LAB — CBC
HCT: 40.7 % (ref 35.0–47.0)
HGB: 13.6 g/dL (ref 12.0–16.0)
MCH: 27.9 pg (ref 26.0–34.0)
MCHC: 33.3 g/dL (ref 32.0–36.0)
MCV: 84 fL (ref 80–100)
Platelet: 205 10*3/uL (ref 150–440)
RBC: 4.86 10*6/uL (ref 3.80–5.20)
RDW: 13.2 % (ref 11.5–14.5)
WBC: 6.3 10*3/uL (ref 3.6–11.0)

## 2015-01-09 LAB — LIPASE, BLOOD: Lipase: 34 U/L

## 2015-03-10 ENCOUNTER — Emergency Department
Admission: EM | Admit: 2015-03-10 | Discharge: 2015-03-10 | Disposition: A | Discharge status: Home / Self Care | Payer: Medicaid Other | Attending: Emergency Medicine | Admitting: Emergency Medicine

## 2015-03-10 ENCOUNTER — Encounter: Payer: Self-pay | Admitting: Emergency Medicine

## 2015-03-10 ENCOUNTER — Emergency Department: Payer: Medicaid Other

## 2015-03-10 DIAGNOSIS — Z3202 Encounter for pregnancy test, result negative: Secondary | ICD-10-CM | POA: Insufficient documentation

## 2015-03-10 DIAGNOSIS — R1032 Left lower quadrant pain: Secondary | ICD-10-CM | POA: Insufficient documentation

## 2015-03-10 HISTORY — DX: Cystitis, unspecified without hematuria: N30.90

## 2015-03-10 HISTORY — DX: Unspecified ovarian cyst, unspecified side: N83.209

## 2015-03-10 HISTORY — DX: Calculus of kidney: N20.0

## 2015-03-10 HISTORY — DX: Renal tubulo-interstitial disease, unspecified: N15.9

## 2015-03-10 LAB — COMPREHENSIVE METABOLIC PANEL
ALBUMIN: 4.3 g/dL (ref 3.5–5.0)
ALT: 12 U/L — ABNORMAL LOW (ref 14–54)
ANION GAP: 5 (ref 5–15)
AST: 15 U/L (ref 15–41)
Alkaline Phosphatase: 59 U/L (ref 38–126)
BUN: 7 mg/dL (ref 6–20)
CALCIUM: 9.1 mg/dL (ref 8.9–10.3)
CO2: 27 mmol/L (ref 22–32)
CREATININE: 0.89 mg/dL (ref 0.44–1.00)
Chloride: 105 mmol/L (ref 101–111)
GFR calc Af Amer: >60 mL/min (ref >60)
Glucose, Bld: 119 mg/dL — ABNORMAL HIGH (ref 65–99)
Potassium: 4 mmol/L (ref 3.5–5.1)
Sodium: 137 mmol/L (ref 135–145)
Total Bilirubin: 0.3 mg/dL (ref 0.3–1.2)
Total Protein: 7.8 g/dL (ref 6.5–8.1)

## 2015-03-10 LAB — URINALYSIS COMPLETE WITH MICROSCOPIC (ARMC ONLY)
Bilirubin Urine: NEGATIVE
Glucose, UA: NEGATIVE mg/dL
Hgb urine dipstick: NEGATIVE
Ketones, ur: NEGATIVE mg/dL
NITRITE: NEGATIVE
PH: 6 (ref 5.0–8.0)
PROTEIN: NEGATIVE mg/dL
Specific Gravity, Urine: 1.015 (ref 1.005–1.030)

## 2015-03-10 LAB — CBC WITH DIFFERENTIAL/PLATELET
BASOS ABS: 0.1 10*3/uL (ref 0–0.1)
BASOS PCT: 1 %
Eosinophils Absolute: 0.1 10*3/uL (ref 0–0.7)
Eosinophils Relative: 2 %
HCT: 41.6 % (ref 35.0–47.0)
Hemoglobin: 14 g/dL (ref 12.0–16.0)
LYMPHS PCT: 35 %
Lymphs Abs: 2 10*3/uL (ref 1.0–3.6)
MCH: 28 pg (ref 26.0–34.0)
MCHC: 33.8 g/dL (ref 32.0–36.0)
MCV: 82.9 fL (ref 80.0–100.0)
Monocytes Absolute: 0.5 10*3/uL (ref 0.2–0.9)
Monocytes Relative: 9 %
NEUTROS ABS: 3.2 10*3/uL (ref 1.4–6.5)
NEUTROS PCT: 53 %
PLATELETS: 222 10*3/uL (ref 150–440)
RBC: 5.01 MIL/uL (ref 3.80–5.20)
RDW: 13.8 % (ref 11.5–14.5)
WBC: 5.9 10*3/uL (ref 3.6–11.0)

## 2015-03-10 LAB — PREGNANCY, URINE: Preg Test, Ur: NEGATIVE

## 2015-03-10 LAB — LIPASE, BLOOD: LIPASE: 37 U/L (ref 22–51)

## 2015-03-10 MED ORDER — MORPHINE SULFATE 4 MG/ML IJ SOLN
INTRAMUSCULAR | Status: AC
  Administered 2015-03-10: 4 mg via INTRAMUSCULAR
  Filled 2015-03-10: qty 1

## 2015-03-10 MED ORDER — ONDANSETRON 4 MG PO TBDP
4.0000 mg | ORAL_TABLET | Freq: Once | ORAL | Status: AC
  Administered 2015-03-10: 4 mg via ORAL

## 2015-03-10 MED ORDER — ONDANSETRON HCL 4 MG PO TABS: 4.0000 mg | ORAL_TABLET | Freq: Three times a day (TID) | ORAL | Status: AC | PRN

## 2015-03-10 MED ORDER — ONDANSETRON 4 MG PO TBDP
ORAL_TABLET | ORAL | Status: AC
  Administered 2015-03-10: 4 mg via ORAL
  Filled 2015-03-10: qty 1

## 2015-03-10 MED ORDER — HYDROCODONE-ACETAMINOPHEN 5-325 MG PO TABS: 1.0000 | ORAL_TABLET | Freq: Four times a day (QID) | ORAL | Status: DC | PRN

## 2015-03-10 MED ORDER — MORPHINE SULFATE 4 MG/ML IJ SOLN
4.0000 mg | Freq: Once | INTRAMUSCULAR | Status: AC
  Administered 2015-03-10: 4 mg via INTRAMUSCULAR

## 2015-03-10 MED ORDER — IBUPROFEN 600 MG PO TABS: 600.0000 mg | ORAL_TABLET | Freq: Three times a day (TID) | ORAL | Status: DC | PRN

## 2015-03-10 NOTE — ED Notes (Signed)
Pt states that she is having BLQ pain but mostly on the left side. She is nauseated but denies Vomiting or diarrhea. Denies any pain while urinating. She states that she has a history of cyst on the ovaries, kidney stones and infections.

## 2015-03-10 NOTE — Discharge Instructions (Signed)
No certain cause was found for your left sided abdominal pain, however your exam and evaluation are reassuring. We discussed holding off on a CT today given risk versus benefit at this point in time.  Return to the emergency department for any new or worsening pain, black or bloody stools, vomiting, fever, or any other symptoms concerning to you.   Abdominal Pain, Women Abdominal (stomach, pelvic, or belly) pain can be caused by many things. It is important to tell your doctor:  The location of the pain.  Does it come and go or is it present all the time?  Are there things that start the pain (eating certain foods, exercise)?  Are there other symptoms associated with the pain (fever, nausea, vomiting, diarrhea)? All of this is helpful to know when trying to find the cause of the pain. CAUSES   Stomach: virus or bacteria infection, or ulcer.  Intestine: appendicitis (inflamed appendix), regional ileitis (Crohn's disease), ulcerative colitis (inflamed colon), irritable bowel syndrome, diverticulitis (inflamed diverticulum of the colon), or cancer of the stomach or intestine.  Gallbladder disease or stones in the gallbladder.  Kidney disease, kidney stones, or infection.  Pancreas infection or cancer.  Fibromyalgia (pain disorder).  Diseases of the female organs:  Uterus: fibroid (non-cancerous) tumors or infection.  Fallopian tubes: infection or tubal pregnancy.  Ovary: cysts or tumors.  Pelvic adhesions (scar tissue).  Endometriosis (uterus lining tissue growing in the pelvis and on the pelvic organs).  Pelvic congestion syndrome (female organs filling up with blood just before the menstrual period).  Pain with the menstrual period.  Pain with ovulation (producing an egg).  Pain with an IUD (intrauterine device, birth control) in the uterus.  Cancer of the female organs.  Functional pain (pain not caused by a disease, may improve without treatment).  Psychological  pain.  Depression. DIAGNOSIS  Your doctor will decide the seriousness of your pain by doing an examination.  Blood tests.  X-rays.  Ultrasound.  CT scan (computed tomography, special type of X-ray).  MRI (magnetic resonance imaging).  Cultures, for infection.  Barium enema (dye inserted in the large intestine, to better view it with X-rays).  Colonoscopy (looking in intestine with a lighted tube).  Laparoscopy (minor surgery, looking in abdomen with a lighted tube).  Major abdominal exploratory surgery (looking in abdomen with a large incision). TREATMENT  The treatment will depend on the cause of the pain.   Many cases can be observed and treated at home.  Over-the-counter medicines recommended by your caregiver.  Prescription medicine.  Antibiotics, for infection.  Birth control pills, for painful periods or for ovulation pain.  Hormone treatment, for endometriosis.  Nerve blocking injections.  Physical therapy.  Antidepressants.  Counseling with a psychologist or psychiatrist.  Minor or major surgery. HOME CARE INSTRUCTIONS   Do not take laxatives, unless directed by your caregiver.  Take over-the-counter pain medicine only if ordered by your caregiver. Do not take aspirin because it can cause an upset stomach or bleeding.  Try a clear liquid diet (broth or water) as ordered by your caregiver. Slowly move to a bland diet, as tolerated, if the pain is related to the stomach or intestine.  Have a thermometer and take your temperature several times a day, and record it.  Bed rest and sleep, if it helps the pain.  Avoid sexual intercourse, if it causes pain.  Avoid stressful situations.  Keep your follow-up appointments and tests, as your caregiver orders.  If the pain does  not go away with medicine or surgery, you may try:  Acupuncture.  Relaxation exercises (yoga, meditation).  Group therapy.  Counseling. SEEK MEDICAL CARE IF:   You  notice certain foods cause stomach pain.  Your home care treatment is not helping your pain.  You need stronger pain medicine.  You want your IUD removed.  You feel faint or lightheaded.  You develop nausea and vomiting.  You develop a rash.  You are having side effects or an allergy to your medicine. SEEK IMMEDIATE MEDICAL CARE IF:   Your pain does not go away or gets worse.  You have a fever.  Your pain is felt only in portions of the abdomen. The right side could possibly be appendicitis. The left lower portion of the abdomen could be colitis or diverticulitis.  You are passing blood in your stools (bright red or black tarry stools, with or without vomiting).  You have blood in your urine.  You develop chills, with or without a fever.  You pass out. MAKE SURE YOU:   Understand these instructions.  Will watch your condition.  Will get help right away if you are not doing well or get worse. Document Released: 07/08/2007 Document Revised: 01/25/2014 Document Reviewed: 07/28/2009 [facility] Patient Information 2015 [place], [place]. This information is not intended to replace advice given to you by your health care provider. Make sure you discuss any questions you have with your health care provider.

## 2015-03-10 NOTE — ED Provider Notes (Signed)
[facility] Emergency Department Provider Note   ____________________________________________  Time seen: 7:30 PM I have reviewed the triage vital signs and the triage nursing note.  HISTORY  Chief Complaint Abdominal Pain   Historian Patient  HPI Suzanne Mitchell is a 39 y.o. female who is complaining of about 2 days of lower abdominal pain on the left side. This feels similar to when she's had ovarian cyst in the past. Pain is been mostly constant. She's had a little bit of frequency of urination but no pain with urination. No right lower quadrant pain. She's had a tubal about 2 years ago. She had a colonoscopy several weeks ago which was normal. She has follow-up with the OB/GYN in a few weeks to consider a hysterectomy due to 10-12 days of heavy vaginal bleeding with each period. No fever, no vomiting, no diarrhea. Some nausea.    Past Medical History  Diagnosis Date  . Kidney stone   . Ovarian cyst   . Bladder infection   . Kidney infection   . Kidney infection     There are no active problems to display for this patient.   Past Surgical History  Procedure Laterality Date  . Abdominal surgery    . Knee surgery    . Tubal ligation      Current Outpatient Rx  Name  Route  Sig  Dispense  Refill  . HYDROcodone-acetaminophen (NORCO/VICODIN) 5-325 MG per tablet   Oral   Take 1 tablet by mouth every 6 (six) hours as needed for moderate pain.   10 tablet   0   . ibuprofen (ADVIL,MOTRIN) 600 MG tablet   Oral   Take 1 tablet (600 mg total) by mouth every 8 (eight) hours as needed.   20 tablet   0   . ondansetron (ZOFRAN) 4 MG tablet   Oral   Take 1 tablet (4 mg total) by mouth every 8 (eight) hours as needed for nausea or vomiting.   10 tablet   1     Allergies Omnipaque; Augmentin; Biaxin; and Keflex  History reviewed. No pertinent family history.  Social History History  Substance Use Topics  . Smoking status: Never Smoker   .  Smokeless tobacco: Not on file  . Alcohol Use: No    Review of Systems  Constitutional: Negative for fever. Eyes: Negative for visual changes. ENT: Negative for sore throat. Cardiovascular: Negative for chest pain. Respiratory: Negative for shortness of breath. Gastrointestinal: Negative for abdominal pain, vomiting and diarrhea. Genitourinary: Negative for dysuria. Musculoskeletal: Negative for back pain. Skin: Negative for rash. Neurological: Negative for headaches, focal weakness or numbness.  ____________________________________________   PHYSICAL EXAM:  VITAL SIGNS: ED Triage Vitals  Enc Vitals Group     BP 03/10/15 1604 121/57 mmHg     Pulse Rate 03/10/15 1604 52     Resp 03/10/15 1604 20     Temp 03/10/15 1604 98.2 F (36.8 C)     Temp Source 03/10/15 1604 Oral     SpO2 03/10/15 1604 98 %     Weight 03/10/15 1604 180 lb (81.647 kg)     Height 03/10/15 1604 5\' 5"  (1.651 m)     Head Cir --      Peak Flow --      Pain Score 03/10/15 1613 5     Pain Loc --      Pain Edu? --      Excl. in GC? --      Constitutional:  Alert and oriented. Well appearing and in no distress. Eyes: Conjunctivae are normal. PERRL. Normal extraocular movements. ENT   Head: Normocephalic and atraumatic.   Nose: No congestion/rhinnorhea.   Mouth/Throat: Mucous membranes are moist.   Neck: No stridor. Cardiovascular: Normal rate, regular rhythm.  No murmurs, rubs, or gallops. Respiratory: Normal respiratory effort without tachypnea nor retractions. Breath sounds are clear and equal bilaterally. No wheezes/rales/rhonchi. Gastrointestinal: Soft. No distention, no guarding, no rebound. Mild to moderate tenderness in the left lower quadrant. Nontender suprapubic upper and right-sided.  Genitourinary/rectal: Deferred Musculoskeletal: Nontender with normal range of motion in all extremities. No joint effusions.  No lower extremity tenderness nor edema. Neurologic:  Normal speech  and language. No gross focal neurologic deficits are appreciated. Skin:  Skin is warm, dry and intact. No rash noted. Psychiatric: Mood and affect are normal. Speech and behavior are normal. Patient exhibits appropriate insight and judgment.  ____________________________________________   EKG I, Governor Rooks, MD, the attending physician have personally viewed and interpreted all ECGs.  None ____________________________________________  LABS (pertinent positives/negatives)  CBC within normal limits Metabolic panel within normal limits Urinalysis trace leukocytes, 0-5 red blood cells and white blood cells with rare bacteria and 6-30 epithelials  ____________________________________________  RADIOLOGY Imaging viewed by me, and interpreted by Radiologist   Ultrasound pelvic and transvaginal:IMPRESSION: Normal ultrasound appearance of the uterus and ovaries. Probable involuting cyst in the right ovary. __________________________________________  PROCEDURES  Procedure(s) performed: None Critical Care performed: None  ____________________________________________   ED COURSE / ASSESSMENT AND PLAN  Pertinent labs & imaging results that were available during my care of the patient were reviewed by me and considered in my medical decision making (see chart for details).  Labs and evaluation are reassuring. Patient deferred pelvic exam at this time stating no vaginal discharge or symptoms, and no exposure to STDs. She does have close follow-up with OB/GYN. Her urinalysis looks like there may be some contamination, but I will send it for a urine culture.  The patient ultrasound is negative to account for source of left sided abdominal pain. Her pain level is down for a 10. She does follow with a GI doctor for history of GI bleeding with history of stomach ulcers, GERD, internal hemorrhoids. She currently takes Carafate and Nexium. She also takes ibuprofen.  We discussed her reassuring  evaluation, and discussed risk versus benefit of CT scan. We decided not to do a CT scan today. She will follow with her OB/GYN. She may also need follow-up with GI. We discussed return precautions, discharge instructions, and follow-up.   ___________________________________________   FINAL CLINICAL IMPRESSION(S) / ED DIAGNOSES   Final diagnoses:  Left lower quadrant pain      Governor Rooks, MD 03/10/15 2312

## 2015-03-10 NOTE — ED Notes (Signed)
Patient reports having abdominal pain for about 2 days states she has a history of ovarian cysts and kidney stones.  States she has taken OTC ibuprofen but has not been effective.

## 2015-03-13 LAB — URINE CULTURE

## 2015-04-09 ENCOUNTER — Encounter: Payer: Self-pay | Admitting: Medical Oncology

## 2015-04-09 ENCOUNTER — Emergency Department
Admission: EM | Admit: 2015-04-09 | Discharge: 2015-04-10 | Disposition: A | Discharge status: Home / Self Care | Payer: Medicaid Other | Attending: Emergency Medicine | Admitting: Emergency Medicine

## 2015-04-09 ENCOUNTER — Emergency Department: Payer: Medicaid Other

## 2015-04-09 ENCOUNTER — Other Ambulatory Visit: Payer: Self-pay

## 2015-04-09 DIAGNOSIS — M25512 Pain in left shoulder: Secondary | ICD-10-CM | POA: Diagnosis not present

## 2015-04-09 DIAGNOSIS — R0789 Other chest pain: Secondary | ICD-10-CM | POA: Insufficient documentation

## 2015-04-09 DIAGNOSIS — Z79899 Other long term (current) drug therapy: Secondary | ICD-10-CM | POA: Diagnosis not present

## 2015-04-09 DIAGNOSIS — Z3202 Encounter for pregnancy test, result negative: Secondary | ICD-10-CM | POA: Insufficient documentation

## 2015-04-09 DIAGNOSIS — R079 Chest pain, unspecified: Secondary | ICD-10-CM | POA: Diagnosis present

## 2015-04-09 HISTORY — DX: Nonrheumatic mitral (valve) prolapse: I34.1

## 2015-04-09 LAB — BASIC METABOLIC PANEL
Anion gap: 9 (ref 5–15)
BUN: 13 mg/dL (ref 6–20)
CALCIUM: 9.3 mg/dL (ref 8.9–10.3)
CO2: 24 mmol/L (ref 22–32)
Chloride: 104 mmol/L (ref 101–111)
Creatinine, Ser: 0.77 mg/dL (ref 0.44–1.00)
GFR calc Af Amer: >60 mL/min (ref >60)
GFR calc non Af Amer: >60 mL/min (ref >60)
GLUCOSE: 116 mg/dL — AB (ref 65–99)
POTASSIUM: 3.6 mmol/L (ref 3.5–5.1)
SODIUM: 137 mmol/L (ref 135–145)

## 2015-04-09 LAB — CBC
HEMATOCRIT: 39.9 % (ref 35.0–47.0)
Hemoglobin: 13.4 g/dL (ref 12.0–16.0)
MCH: 27.7 pg (ref 26.0–34.0)
MCHC: 33.7 g/dL (ref 32.0–36.0)
MCV: 82.2 fL (ref 80.0–100.0)
Platelets: 234 10*3/uL (ref 150–440)
RBC: 4.85 MIL/uL (ref 3.80–5.20)
RDW: 13.6 % (ref 11.5–14.5)
WBC: 8.6 10*3/uL (ref 3.6–11.0)

## 2015-04-09 LAB — TROPONIN I: Troponin I: <0.03 ng/mL (ref <0.031)

## 2015-04-09 MED ORDER — ASPIRIN 81 MG PO CHEW
324.0000 mg | CHEWABLE_TABLET | Freq: Once | ORAL | Status: AC
  Administered 2015-04-09: 324 mg via ORAL
  Filled 2015-04-09: qty 4

## 2015-04-09 NOTE — ED Notes (Signed)
Pt ambulatory to triage with reports of left sided chest pain that initally began last night, lasted 2 hrs and went away. Pain returned around 1700 today worse than last night, pt reports some sob with pain.

## 2015-04-09 NOTE — ED Notes (Signed)
Patient states that she developed some chest pain last night but that she has a history of mitral valve prolapse. Patient reports that tonight she developed sharpe left side chest pain that is radiating to her neck and shoulder. She reports this is different then her normal pain. Patient also reports that she has had some nausea and shortness of breath.

## 2015-04-09 NOTE — Discharge Instructions (Signed)

## 2015-04-09 NOTE — ED Provider Notes (Signed)
[facility] Emergency Department Provider Note  ____________________________________________  Time seen: Approximately 9:02 PM  I have reviewed the triage vital signs and the nursing notes.   HISTORY  Chief Complaint Chest Pain    HPI Suzanne Mitchell is a 39 y.o. 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  He states that she first felt that last night and did not think too much of it.  It was mild and lasted about 2 hours and located in the left side of her anterior chest.  It then completely resolved.  Today it started back up again, moderate severity this time, and accompanied with mild nausea and some mild shortness of breath.  It is still present although now it is a sharp pain in her left shoulder and she does not feel the pressure.  She has not had this type of pain in the past.  She has a cardiologist at [facility].She takes no exogenous estrogen, has not been a long trip recently nor had surgery, has never had any blood clots in her legs nor her lungs.   Past Medical History  Diagnosis Date  . Kidney stone   . Ovarian cyst   . Bladder infection   . Kidney infection   . Kidney infection   . Mitral valve prolapse     There are no active problems to display for this patient.   Past Surgical History  Procedure Laterality Date  . Abdominal surgery    . Knee surgery    . Tubal ligation      Current Outpatient Rx  Name  Route  Sig  Dispense  Refill  . esomeprazole (NEXIUM) 40 MG capsule   Oral   Take 40 mg by mouth daily at 12 noon.         Marland Kitchen HYDROcodone-acetaminophen (NORCO/VICODIN) 5-325 MG per tablet   Oral   Take 1 tablet by mouth every 6 (six) hours as needed for moderate pain.   10 tablet   0   . ibuprofen (ADVIL,MOTRIN) 600 MG tablet   Oral   Take  1 tablet (600 mg total) by mouth every 8 (eight) hours as needed.   20 tablet   0   . ondansetron (ZOFRAN) 4 MG tablet   Oral   Take 1 tablet (4 mg total) by mouth every 8 (eight) hours as needed for nausea or vomiting.   10 tablet   1     Allergies Omnipaque; Augmentin; Biaxin; and Keflex  No family history on file.  Social History History  Substance Use Topics  . Smoking status: Never Smoker   . Smokeless tobacco: Not on file  . Alcohol Use: No    Review of Systems Constitutional: No fever/chills Eyes: No visual changes. ENT: No sore throat. Cardiovascular: Left Sided chest pain/pressure Respiratory: Mild shortness of breath associated with the chest pain Gastrointestinal: No abdominal pain.  Mild nausea earlier now resolved, no vomiting.  No diarrhea.  No constipation. Genitourinary: Negative for dysuria. Musculoskeletal: Negative for back pain. Skin: Negative for rash. Neurological: Negative for headaches, focal weakness or numbness.  10-point ROS otherwise negative.  ____________________________________________   PHYSICAL EXAM:  VITAL SIGNS: ED Triage Vitals  Enc Vitals Group     BP 04/09/15 1857 128/68 mmHg     Pulse Rate 04/09/15 1857  78     Resp 04/09/15 1857 18     Temp 04/09/15 1857 98.1 F (36.7 C)     Temp Source 04/09/15 1857 Oral     SpO2 04/09/15 1857 98 %     Weight 04/09/15 1857 180 lb (81.647 kg)     Height 04/09/15 1857  (1.626 m)     Head Cir --      Peak Flow --      Pain Score 04/09/15 1858 5     Pain Loc --      Pain Edu? --      Excl. in GC? --     Constitutional: Alert and oriented. Well appearing and in no acute distress. Eyes: Conjunctivae are normal. PERRL. EOMI. Head: Atraumatic. Nose: No congestion/rhinnorhea. Mouth/Throat: Mucous membranes are moist.  Oropharynx non-erythematous. Neck: No stridor.   Cardiovascular: Normal rate, regular rhythm. Grossly normal heart sounds.  Good peripheral  circulation. Respiratory: Normal respiratory effort.  No retractions. Lungs CTAB. Gastrointestinal: Soft and nontender. No distention. No abdominal bruits. No CVA tenderness. Musculoskeletal: No lower extremity tenderness nor edema.  No joint effusions. Neurologic:  Normal speech and language. No gross focal neurologic deficits are appreciated.  Skin:  Skin is warm, dry and intact. No rash noted.   ____________________________________________   LABS (all labs ordered are listed, but only abnormal results are displayed)  Labs Reviewed  BASIC METABOLIC PANEL - Abnormal; Notable for the following:    Glucose, Bld 116 (*)    All other components within normal limits  CBC  TROPONIN I  TROPONIN I   ____________________________________________  EKG  ED ECG REPORT I, Keylee Shrestha, the attending physician, personally viewed and interpreted this ECG.  Date: 04/09/2015 EKG Time: 18:54 Rate: 82 Rhythm: normal sinus rhythm QRS Axis: normal Intervals: normal ST/T Wave abnormalities: normal Conduction Disutrbances: none Narrative Interpretation: unremarkable  ____________________________________________  RADIOLOGY  I, Annalia Metzger, personally viewed and evaluated these images as part of my medical decision making.   Dg Chest 2 View  04/09/2015   CLINICAL DATA:  Chest pain since yesterday  EXAM: CHEST - 2 VIEW  COMPARISON:  10/29/2014  FINDINGS: The heart size and mediastinal contours are within normal limits. Both lungs are clear. The visualized skeletal structures are unremarkable.  IMPRESSION: No active disease.   Electronically Signed   By: Alcide Clever M.D.   On: 04/09/2015 19:30    ____________________________________________   PROCEDURES  Procedure(s) performed: None  Critical Care performed: No ____________________________________________   INITIAL IMPRESSION / ASSESSMENT AND PLAN / ED COURSE  Pertinent labs & imaging results that were available during my care  of the patient were reviewed by me and considered in my medical decision making (see chart for details).  Pulmonary Embolism Rule-out Criteria (PERC rule)                        If YES to ANY of the following, the PERC rule is not satisfied and cannot be used to rule out PE in this patient (consider d-dimer or imaging depending on pre-test probability).                      If NO to ALL of the following, AND the clinician's pre-test probability is <15%, the [facility] rule is satisfied and there is no need for further workup (including no need to obtain a d-dimer) as the post-test probability of pulmonary embolism is <2%.  Mnemonic is HAD CLOTS   H - hormone use (exogenous estrogen)      No. A - age > 50                                                 No. D - DVT/PE history                                      No.   C - coughing blood (hemoptysis)                 No. L - leg swelling, unilateral                             No. O - O2 Sat on Room Air < 95%                  No. T - tachycardia (HR ? 100)                         No. S - surgery or trauma, recent                      No.   Based on my evaluation of the patient, including application of this decision instrument, further testing to evaluate for pulmonary embolism is not indicated at this time. I have discussed this recommendation with the patient who states understanding and agreement with this plan.  The patient is complaining of some intermittent atypical chest pain in her left anterior chest.  Her labs are reassuring including troponins negative 2.  Her chest x-ray is unremarkable.  She is PERC negative as documented above.  Her vital signs are stable.  Her risk of a serious cardiac event or other emergent medical condition is extremely low based on her evaluation and workup today.  I discussed this with the patient.  She has a cardiologist in [place] with whom she can follow up with on Monday.  I gave her my  usual and customary return precautions, and she understands and agrees.   ____________________________________________  FINAL CLINICAL IMPRESSION(S) / ED DIAGNOSES  Final diagnoses:  Atypical chest pain      NEW MEDICATIONS STARTED DURING THIS VISIT:  New Prescriptions   No medications on file     Loleta Roseory Ayasha Ellingsen, MD 04/09/15 2341

## 2015-04-09 NOTE — ED Notes (Signed)
MD at bedside. 

## 2015-04-11 LAB — POCT PREGNANCY, URINE: Preg Test, Ur: NEGATIVE

## 2015-05-24 ENCOUNTER — Encounter: Payer: Self-pay | Admitting: Emergency Medicine

## 2015-05-24 ENCOUNTER — Emergency Department: Payer: Medicaid Other

## 2015-05-24 ENCOUNTER — Emergency Department
Admission: EM | Admit: 2015-05-24 | Discharge: 2015-05-24 | Disposition: A | Discharge status: Home / Self Care | Payer: Medicaid Other | Attending: Student | Admitting: Student

## 2015-05-24 DIAGNOSIS — S60222A Contusion of left hand, initial encounter: Secondary | ICD-10-CM | POA: Diagnosis not present

## 2015-05-24 DIAGNOSIS — Y92410 Unspecified street and highway as the place of occurrence of the external cause: Secondary | ICD-10-CM | POA: Diagnosis not present

## 2015-05-24 DIAGNOSIS — Y998 Other external cause status: Secondary | ICD-10-CM | POA: Diagnosis not present

## 2015-05-24 DIAGNOSIS — S60221A Contusion of right hand, initial encounter: Secondary | ICD-10-CM

## 2015-05-24 DIAGNOSIS — S80211A Abrasion, right knee, initial encounter: Secondary | ICD-10-CM | POA: Diagnosis not present

## 2015-05-24 DIAGNOSIS — Y9389 Activity, other specified: Secondary | ICD-10-CM | POA: Diagnosis not present

## 2015-05-24 DIAGNOSIS — S9031XA Contusion of right foot, initial encounter: Secondary | ICD-10-CM | POA: Diagnosis not present

## 2015-05-24 DIAGNOSIS — Z791 Long term (current) use of non-steroidal anti-inflammatories (NSAID): Secondary | ICD-10-CM | POA: Insufficient documentation

## 2015-05-24 DIAGNOSIS — W1809XA Striking against other object with subsequent fall, initial encounter: Secondary | ICD-10-CM

## 2015-05-24 DIAGNOSIS — Z79899 Other long term (current) drug therapy: Secondary | ICD-10-CM | POA: Insufficient documentation

## 2015-05-24 DIAGNOSIS — S6991XA Unspecified injury of right wrist, hand and finger(s), initial encounter: Secondary | ICD-10-CM | POA: Diagnosis present

## 2015-05-24 MED ORDER — BACLOFEN 10 MG PO TABS: 10.0000 mg | ORAL_TABLET | Freq: Three times a day (TID) | ORAL | Status: DC

## 2015-05-24 MED ORDER — NAPROXEN 500 MG PO TBEC: 500.0000 mg | DELAYED_RELEASE_TABLET | Freq: Two times a day (BID) | ORAL | Status: AC

## 2015-05-24 NOTE — ED Provider Notes (Signed)
[facility] Emergency Department Provider Note  ____________________________________________  Time seen: Approximately 1:32 PM  I have reviewed the triage vital signs and the nursing notes.   HISTORY  Chief Complaint Fall    HPI Suzanne Mitchell is a 39 y.o. female who presents for evaluation of falling after getting out of the truck prior to arrival. Patient states that she stepped on uneven pavement while wearing "pumps".Complains of right lateral foot pain, bilateral thumb pain, and left knee contusion.   Past Medical History  Diagnosis Date  . Kidney stone   . Ovarian cyst   . Bladder infection   . Kidney infection   . Kidney infection   . Mitral valve prolapse     There are no active problems to display for this patient.   Past Surgical History  Procedure Laterality Date  . Abdominal surgery    . Knee surgery    . Tubal ligation      Current Outpatient Rx  Name  Route  Sig  Dispense  Refill  . baclofen (LIORESAL) 10 MG tablet   Oral   Take 1 tablet (10 mg total) by mouth 3 (three) times daily.   30 tablet   0   . esomeprazole (NEXIUM) 40 MG capsule   Oral   Take 40 mg by mouth daily at 12 noon.         . naproxen (EC NAPROSYN) 500 MG EC tablet   Oral   Take 1 tablet (500 mg total) by mouth 2 (two) times daily with a meal.   60 tablet   0   . ondansetron (ZOFRAN) 4 MG tablet   Oral   Take 1 tablet (4 mg total) by mouth every 8 (eight) hours as needed for nausea or vomiting.   10 tablet   1     Allergies Omnipaque; Augmentin; Biaxin; and Keflex  History reviewed. No pertinent family history.  Social History Social History  Substance Use Topics  . Smoking status: Never Smoker   . Smokeless tobacco: None  . Alcohol Use: No    Review of Systems Constitutional: No fever/chills Eyes: No visual changes. ENT: No sore throat. Cardiovascular: Denies chest pain. Respiratory: Denies shortness of breath. Gastrointestinal:  No abdominal pain.  No nausea, no vomiting.  No diarrhea.  No constipation. Genitourinary: Negative for dysuria. Musculoskeletal: Negative for back pain. Positive for bilateral hand pain and knee pain and right foot pain. Skin: Negative for rash. Neurological: Negative for headaches, focal weakness or numbness.  10-point ROS otherwise negative.  ____________________________________________   PHYSICAL EXAM:  VITAL SIGNS: ED Triage Vitals  Enc Vitals Group     BP 05/24/15 1309 125/81 mmHg     Pulse Rate 05/24/15 1309 73     Resp 05/24/15 1309 20     Temp 05/24/15 1309 98.3 F (36.8 C)     Temp Source 05/24/15 1309 Oral     SpO2 05/24/15 1309 99 %     Weight 05/24/15 1309 180 lb (81.647 kg)     Height 05/24/15 1309 5\' 5"  (1.651 m)     Head Cir --      Peak Flow --      Pain Score 05/24/15 1309 4     Pain Loc --      Pain Edu? --      Excl. in GC? --     Constitutional: Alert and oriented. Well appearing and in no acute distress. Eyes: Conjunctivae are normal. PERRL. EOMI. Head:  Atraumatic. Nose: No congestion/rhinnorhea. Mouth/Throat: Mucous membranes are moist.  Oropharynx non-erythematous. Neck: No stridor.   Cardiovascular: Normal rate, regular rhythm. Grossly normal heart sounds.  Good peripheral circulation. Respiratory: Normal respiratory effort.  No retractions. Lungs CTAB. Gastrointestinal: Soft and nontender. No distention. No abdominal bruits. No CVA tenderness. Musculoskeletal: Tenderness and bilateral thenar aspects of both hands. No evidence of ecchymosis or bleeding. Full range of motion of the thumb noted. Positive strength good passive and active. Distally neurovascularly intact on both hands and thumb. Right foot with 2 cm contusion noted laterally over the fifth metatarsal distally neurovascularly intact for range of motion of all toes. Left knee with superficial skin abrasions noted with full range of motion left knee with positive strength Neurologic:   Normal speech and language. No gross focal neurologic deficits are appreciated. No gait instability. Skin:  Skin is warm, dry and intact. No rash noted. Psychiatric: Mood and affect are normal. Speech and behavior are normal.  ____________________________________________   LABS (all labs ordered are listed, but only abnormal results are displayed)  Labs Reviewed - No data to display ____________________________________________  RADIOLOGY  Bilateral thumbs and right foot x-ray is negative by radiologist and reviewed by myself. ____________________________________________   PROCEDURES  Procedure(s) performed: None  Critical Care performed: No  ____________________________________________   INITIAL IMPRESSION / ASSESSMENT AND PLAN / ED COURSE  Pertinent labs & imaging results that were available during my care of the patient were reviewed by me and considered in my medical decision making (see chart for details).  Status post fall with bilateral thumb contusions, right foot contusion and left knee contusion 8 Rx given for baclofen and naproxen. Patient voices understanding and will return to the ER with any worsening symptomology. She denies any other emergency medical complaints at this time. ____________________________________________   FINAL CLINICAL IMPRESSION(S) / ED DIAGNOSES  Final diagnoses:  Fall against object, initial encounter  Hand contusion, left, initial encounter  Hand contusion, right, initial encounter  Foot contusion, right, initial encounter      Evangeline Dakin, PA-C 05/24/15 1505  Sharman Cheek, MD 05/24/15 [phone]

## 2015-05-24 NOTE — ED Notes (Signed)
Patient to ED with c/o right ankle pain, left knee pain and bilateral wrist pain. Patient walking across parking lot and fell carrying child.

## 2015-05-24 NOTE — ED Notes (Signed)
Splint applied to right ankle.  D/c inst to mother.

## 2015-05-24 NOTE — Discharge Instructions (Signed)
Foot Contusion A foot contusion is a deep bruise to the foot. Contusions are the result of an injury that caused bleeding under the skin. The contusion may turn blue, purple, or yellow. Minor injuries will give you a painless contusion, but more severe contusions may stay painful and swollen for a few weeks. CAUSES  A foot contusion comes from a direct blow to that area, such as a heavy object falling on the foot. SYMPTOMS   Swelling of the foot.  Discoloration of the foot.  Tenderness or soreness of the foot. DIAGNOSIS  You will have a physical exam and will be asked about your history. You may need an X-ray of your foot to look for a broken bone (fracture).  TREATMENT  An elastic wrap may be recommended to support your foot. Resting, elevating, and applying cold compresses to your foot are often the best treatments for a foot contusion. Over-the-counter medicines may also be recommended for pain control. HOME CARE INSTRUCTIONS   Put ice on the injured area.  Put ice in a plastic bag.  Place a towel between your skin and the bag.  Leave the ice on for 15-20 minutes, 03-04 times a day.  Only take over-the-counter or prescription medicines for pain, discomfort, or fever as directed by your caregiver.  If told, use an elastic wrap as directed. This can help reduce swelling. You may remove the wrap for sleeping, showering, and bathing. If your toes become numb, cold, or blue, take the wrap off and reapply it more loosely.  Elevate your foot with pillows to reduce swelling.  Try to avoid standing or walking while the foot is painful. Do not resume use until instructed by your caregiver. Then, begin use gradually. If pain develops, decrease use. Gradually increase activities that do not cause discomfort until you have normal use of your foot.  See your caregiver as directed. It is very important to keep all follow-up appointments in order to avoid any lasting problems with your foot,  including long-term (chronic) pain. SEEK IMMEDIATE MEDICAL CARE IF:   You have increased redness, swelling, or pain in your foot.  Your swelling or pain is not relieved with medicines.  You have loss of feeling in your foot or are unable to move your toes.  Your foot turns cold or blue.  You have pain when you move your toes.  Your foot becomes warm to the touch.  Your contusion does not improve in 2 days. MAKE SURE YOU:   Understand these instructions.  Will watch your condition.  Will get help right away if you are not doing well or get worse. Document Released: 07/02/2006 Document Revised: 03/11/2012 Document Reviewed: 08/14/2011 [facility] Patient Information 2015 [place], [place]. This information is not intended to replace advice given to you by your health care provider. Make sure you discuss any questions you have with your health care provider.  Hand Contusion A hand contusion is a deep bruise on your hand area. Contusions are the result of an injury that caused bleeding under the skin. The contusion may turn blue, purple, or yellow. Minor injuries will give you a painless contusion, but more severe contusions may stay painful and swollen for a few weeks. CAUSES  A contusion is usually caused by a blow, trauma, or direct force to an area of the body. SYMPTOMS   Swelling and redness of the injured area.  Discoloration of the injured area.  Tenderness and soreness of the injured area.  Pain. DIAGNOSIS  The diagnosis can be made by taking a history and performing a physical exam. An X-ray, CT scan, or MRI may be needed to determine if there were any associated injuries, such as broken bones (fractures). TREATMENT  Often, the best treatment for a hand contusion is resting, elevating, icing, and applying cold compresses to the injured area. Over-the-counter medicines may also be recommended for pain control. HOME CARE INSTRUCTIONS   Put ice on the injured area.  Put  ice in a plastic bag.  Place a towel between your skin and the bag.  Leave the ice on for 15-20 minutes, 03-04 times a day.  Only take over-the-counter or prescription medicines as directed by your caregiver. Your caregiver may recommend avoiding anti-inflammatory medicines (aspirin, ibuprofen, and naproxen) for 48 hours because these medicines may increase bruising.  If told, use an elastic wrap as directed. This can help reduce swelling. You may remove the wrap for sleeping, showering, and bathing. If your fingers become numb, cold, or blue, take the wrap off and reapply it more loosely.  Elevate your hand with pillows to reduce swelling.  Avoid overusing your hand if it is painful. SEEK IMMEDIATE MEDICAL CARE IF:   You have increased redness, swelling, or pain in your hand.  Your swelling or pain is not relieved with medicines.  You have loss of feeling in your hand or are unable to move your fingers.  Your hand turns cold or blue.  You have pain when you move your fingers.  Your hand becomes warm to the touch.  Your contusion does not improve in 2 days. MAKE SURE YOU:   Understand these instructions.  Will watch your condition.  Will get help right away if you are not doing well or get worse. Document Released: 03/02/2002 Document Revised: 06/04/2012 Document Reviewed: 03/03/2012 [facility] Patient Information 2015 [place], [place]. This information is not intended to replace advice given to you by your health care provider. Make sure you discuss any questions you have with your health care provider.

## 2015-06-28 ENCOUNTER — Ambulatory Visit (INDEPENDENT_AMBULATORY_CARE_PROVIDER_SITE_OTHER): Payer: Medicaid Other

## 2015-06-28 ENCOUNTER — Ambulatory Visit (INDEPENDENT_AMBULATORY_CARE_PROVIDER_SITE_OTHER): Payer: Medicaid Other | Admitting: Cardiovascular Disease

## 2015-06-28 ENCOUNTER — Encounter: Payer: Self-pay | Admitting: Cardiovascular Disease

## 2015-06-28 ENCOUNTER — Encounter (INDEPENDENT_AMBULATORY_CARE_PROVIDER_SITE_OTHER): Payer: Self-pay

## 2015-06-28 VITALS — BP 120/80 | HR 80 | Ht 65.0 in | Wt 187.8 lb

## 2015-06-28 DIAGNOSIS — R0602 Shortness of breath: Secondary | ICD-10-CM

## 2015-06-28 DIAGNOSIS — R079 Chest pain, unspecified: Secondary | ICD-10-CM | POA: Diagnosis not present

## 2015-06-28 DIAGNOSIS — R002 Palpitations: Secondary | ICD-10-CM | POA: Insufficient documentation

## 2015-06-28 NOTE — Patient Instructions (Addendum)
Medication Instructions:  Your physician recommends that you continue on your current medications as directed. Please refer to the Current Medication list given to you today.   Labwork: none  Testing/Procedures: Your physician has requested that you have an echocardiogram. Echocardiography is a painless test that uses sound waves to create images of your heart. It provides your doctor with information about the size and shape of your heart and how well your heart's chambers and valves are working. This procedure takes approximately one hour. There are no restrictions for this procedure.  Your physician has recommended that you wear a holter monitor. Holter monitors are medical devices that record the heart's electrical activity. Doctors most often use these monitors to diagnose arrhythmias. Arrhythmias are problems with the speed or rhythm of the heartbeat. The monitor is a small, portable device. You can wear one while you do your normal daily activities. This is usually used to diagnose what is causing palpitations/syncope (passing out).    Follow-Up: Your physician recommends that you schedule a follow-up appointment as needed.    Any Other Special Instructions Will Be Listed Below (If Applicable).  Echocardiogram An echocardiogram, or echocardiography, uses sound waves (ultrasound) to produce an image of your heart. The echocardiogram is simple, painless, obtained within a short period of time, and offers valuable information to your health care provider. The images from an echocardiogram can provide information such as:  Evidence of coronary artery disease (CAD).  Heart size.  Heart muscle function.  Heart valve function.  Aneurysm detection.  Evidence of a past heart attack.  Fluid buildup around the heart.  Heart muscle thickening.  Assess heart valve function. LET [facility] CARE PROVIDER KNOW ABOUT:  Any allergies you have.  All medicines you are taking,  including vitamins, herbs, eye drops, creams, and over-the-counter medicines.  Previous problems you or members of your family have had with the use of anesthetics.  Any blood disorders you have.  Previous surgeries you have had.  Medical conditions you have.  Possibility of pregnancy, if this applies. BEFORE THE PROCEDURE  No special preparation is needed. Eat and drink normally.  PROCEDURE   In order to produce an image of your heart, gel will be applied to your chest and a wand-like tool (transducer) will be moved over your chest. The gel will help transmit the sound waves from the transducer. The sound waves will harmlessly bounce off your heart to allow the heart images to be captured in real-time motion. These images will then be recorded.  You may need an IV to receive a medicine that improves the quality of the pictures. AFTER THE PROCEDURE You may return to your normal schedule including diet, activities, and medicines, unless your health care provider tells you otherwise. Document Released: 09/07/2000 Document Revised: 01/25/2014 Document Reviewed: 05/18/2013 [facility] Patient Information 2015 [place], [place]. This information is not intended to replace advice given to you by your health care provider. Make sure you discuss any questions you have with your health care provider. Echocardiogram An echocardiogram, or echocardiography, uses sound waves (ultrasound) to produce an image of your heart. The echocardiogram is simple, painless, obtained within a short period of time, and offers valuable information to your health care provider. The images from an echocardiogram can provide information such as:  Evidence of coronary artery disease (CAD).  Heart size.  Heart muscle function.  Heart valve function.  Aneurysm detection.  Evidence of a past heart attack.  Fluid buildup around the heart.  Heart  muscle thickening.  Assess heart valve function. LET [facility] CARE  PROVIDER KNOW ABOUT:  Any allergies you have.  All medicines you are taking, including vitamins, herbs, eye drops, creams, and over-the-counter medicines.  Previous problems you or members of your family have had with the use of anesthetics.  Any blood disorders you have.  Previous surgeries you have had.  Medical conditions you have.  Possibility of pregnancy, if this applies. BEFORE THE PROCEDURE  No special preparation is needed. Eat and drink normally.  PROCEDURE   In order to produce an image of your heart, gel will be applied to your chest and a wand-like tool (transducer) will be moved over your chest. The gel will help transmit the sound waves from the transducer. The sound waves will harmlessly bounce off your heart to allow the heart images to be captured in real-time motion. These images will then be recorded.  You may need an IV to receive a medicine that improves the quality of the pictures. AFTER THE PROCEDURE You may return to your normal schedule including diet, activities, and medicines, unless your health care provider tells you otherwise. Document Released: 09/07/2000 Document Revised: 01/25/2014 Document Reviewed: 05/18/2013 [facility] Patient Information 2015 [place], [place]. This information is not intended to replace advice given to you by your health care provider. Make sure you discuss any questions you have with your health care provider. Cardiac Event Monitoring A cardiac event monitor is a small recording device used to help detect abnormal heart rhythms (arrhythmias). The monitor is used to record heart rhythm when noticeable symptoms such as the following occur:  Fast heartbeats (palpitations), such as heart racing or fluttering.  Dizziness.  Fainting or light-headedness.  Unexplained weakness. The monitor is wired to two electrodes placed on your chest. Electrodes are flat, sticky disks that attach to your skin. The monitor can be worn for up to 30  days. You will wear the monitor at all times, except when bathing.  HOW TO USE YOUR CARDIAC EVENT MONITOR A technician will prepare your chest for the electrode placement. The technician will show you how to place the electrodes, how to work the monitor, and how to replace the batteries. Take time to practice using the monitor before you leave the office. Make sure you understand how to send the information from the monitor to your health care provider. This requires a telephone with a landline, not a cell phone. You need to:  Wear your monitor at all times, except when you are in water:  Do not get the monitor wet.  Take the monitor off when bathing. Do not swim or use a hot tub with it on.  Keep your skin clean. Do not put body lotion or moisturizer on your chest.  Change the electrodes daily or any time they stop sticking to your skin. You might need to use tape to keep them on.  It is possible that your skin under the electrodes could become irritated. To keep this from happening, try to put the electrodes in slightly different places on your chest. However, they must remain in the area under your left breast and in the upper right section of your chest.  Make sure the monitor is safely clipped to your clothing or in a location close to your body that your health care provider recommends.  Press the button to record when you feel symptoms of heart trouble, such as dizziness, weakness, light-headedness, palpitations, thumping, shortness of breath, unexplained weakness, or a fluttering  or racing heart. The monitor is always on and records what happened slightly before you pressed the button, so do not worry about being too late to get good information.  Keep a diary of your activities, such as walking, doing chores, and taking medicine. It is especially important to note what you were doing when you pushed the button to record your symptoms. This will help your health care provider determine  what might be contributing to your symptoms. The information stored in your monitor will be reviewed by your health care provider alongside your diary entries.  Send the recorded information as recommended by your health care provider. It is important to understand that it will take some time for your health care provider to process the results.  Change the batteries as recommended by your health care provider. SEEK IMMEDIATE MEDICAL CARE IF:   You have chest pain.  You have extreme difficulty breathing or shortness of breath.  You develop a very fast heartbeat that persists.  You develop dizziness that does not go away.  You faint or constantly feel you are about to faint. Document Released: 06/19/2008 Document Revised: 01/25/2014 Document Reviewed: 03/09/2013 [facility] Patient Information 2015 [place], [place]. This information is not intended to replace advice given to you by your health care provider. Make sure you discuss any questions you have with your health care provider.

## 2015-06-28 NOTE — Progress Notes (Signed)
HPI  This is a pleasant 39 year old female who is here today for evaluation of shortness of breath and palpitations. She reports being diagnosed with mitral valve prolapse in 2013 when she had palpitations during pregnancy. She was managed conservatively. Otherwise she has no significant chronic medical conditions. Over the last few days, she has experienced recurrent symptoms of palpitations described as skipping associated with increased shortness of breath, dizziness and presyncope. She also experiences chest pain during these episodes. She went to the emergency room at [facility] where her basic workup was negative including cardiac enzymes and thyroid function. She also underwent CTA of the chest which showed no evidence of pulmonary embolism. She returned for another emergency room visit with the same symptoms with negative workup. She was advised to follow-up with cardiology. She feels extremely anxious during these episodes.  Allergies  Allergen Reactions  . Omnipaque [Iohexol] Hives and Itching  . Augmentin [Amoxicillin-Pot Clavulanate] Hives  . Biaxin [Clarithromycin]   . Keflex [Cephalexin] Hives     Current Outpatient Prescriptions on File Prior to Visit  Medication Sig Dispense Refill  . esomeprazole (NEXIUM) 40 MG capsule Take 40 mg by mouth daily at 12 noon.    . naproxen (EC NAPROSYN) 500 MG EC tablet Take 1 tablet (500 mg total) by mouth 2 (two) times daily with a meal. (Patient taking differently: Take 500 mg by mouth as needed. ) 60 tablet 0  . ondansetron (ZOFRAN) 4 MG tablet Take 1 tablet (4 mg total) by mouth every 8 (eight) hours as needed for nausea or vomiting. 10 tablet 1   No current facility-administered medications on file prior to visit.     Past Medical History  Diagnosis Date  . Kidney stone   . Ovarian cyst   . Bladder infection   . Kidney infection   . Kidney infection   . Mitral valve prolapse      Past Surgical History  Procedure Laterality Date  .  Abdominal surgery    . Knee surgery    . Tubal ligation       Family History  Problem Relation Age of Onset  . Family history unknown: Yes     Social History   Social History  . Marital Status: Married    Spouse Name: N/A  . Number of Children: N/A  . Years of Education: N/A   Occupational History  . Not on file.   Social History Main Topics  . Smoking status: Never Smoker   . Smokeless tobacco: Not on file  . Alcohol Use: No  . Drug Use: Not on file  . Sexual Activity: Not on file   Other Topics Concern  . Not on file   Social History Narrative     ROS A 10 point review of system was performed. It is negative other than that mentioned in the history of present illness.   PHYSICAL EXAM   BP 120/80 mmHg  Pulse 80  Ht  (1.651 m)  Wt 187 lb 12 oz (85.163 kg)  BMI 31.24 kg/m2  Constitutional: She is oriented to person, place, and time. She appears well-developed and well-nourished. No distress.  HENT: No nasal discharge.  Head: Normocephalic and atraumatic.  Eyes: Pupils are equal and round. No discharge.  Neck: Normal range of motion. Neck supple. No JVD present. No thyromegaly present.  Cardiovascular: Normal rate, regular rhythm, normal heart sounds. Exam reveals no gallop and no friction rub. No murmur heard.  Pulmonary/Chest: Effort normal and breath  sounds normal. No stridor. No respiratory distress. She has no wheezes. She has no rales. She exhibits no tenderness.  Abdominal: Soft. Bowel sounds are normal. She exhibits no distension. There is no tenderness. There is no rebound and no guarding.  Musculoskeletal: Normal range of motion. She exhibits no edema and no tenderness.  Neurological: She is alert and oriented to person, place, and time. Coordination normal.  Skin: Skin is warm and dry. No rash noted. She is not diaphoretic. No erythema. No pallor.  Psychiatric: She has a normal mood and affect. Her behavior is normal. Judgment and thought  content normal.    EKG: Normal sinus rhythm with no significant ST or T wave changes.   ASSESSMENT AND PLAN

## 2015-06-28 NOTE — Assessment & Plan Note (Signed)
The symptoms are highly suggestive of premature beats. She does not consume excessive amount of caffeine and her recent thyroid function was normal. I requested a 48 hour Holter monitor to quantify the PVCs. Will consider treatment with a beta blocker based on the findings.

## 2015-06-28 NOTE — Assessment & Plan Note (Signed)
She's been having increased shortness of breath and chest pain during these episodes. She reports previous history of mitral valve prolapse. I do not appreciate heart murmurs by exam today. I requested an echocardiogram for evaluation.

## 2015-07-21 ENCOUNTER — Ambulatory Visit (INDEPENDENT_AMBULATORY_CARE_PROVIDER_SITE_OTHER): Payer: Medicaid Other

## 2015-07-21 ENCOUNTER — Other Ambulatory Visit: Payer: Self-pay

## 2015-07-21 DIAGNOSIS — R0602 Shortness of breath: Secondary | ICD-10-CM

## 2015-08-11 ENCOUNTER — Telehealth: Payer: Self-pay

## 2015-08-11 NOTE — Telephone Encounter (Signed)
Reviewed holter monitor results w/pt who verbalized understanding.

## 2015-08-28 ENCOUNTER — Emergency Department
Admission: EM | Admit: 2015-08-28 | Discharge: 2015-08-28 | Disposition: A | Discharge status: Home / Self Care | Payer: Medicaid Other | Attending: Emergency Medicine | Admitting: Emergency Medicine

## 2015-08-28 ENCOUNTER — Encounter: Payer: Self-pay | Admitting: Emergency Medicine

## 2015-08-28 DIAGNOSIS — Z79899 Other long term (current) drug therapy: Secondary | ICD-10-CM | POA: Insufficient documentation

## 2015-08-28 DIAGNOSIS — J029 Acute pharyngitis, unspecified: Secondary | ICD-10-CM | POA: Diagnosis not present

## 2015-08-28 DIAGNOSIS — J069 Acute upper respiratory infection, unspecified: Secondary | ICD-10-CM | POA: Diagnosis not present

## 2015-08-28 LAB — POCT RAPID STREP A: Streptococcus, Group A Screen (Direct): NEGATIVE

## 2015-08-28 MED ORDER — DIPHENHYDRAMINE HCL 12.5 MG/5ML PO ELIX
25.0000 mg | ORAL_SOLUTION | Freq: Once | ORAL | Status: AC
  Administered 2015-08-28: 25 mg via ORAL
  Filled 2015-08-28: qty 10

## 2015-08-28 MED ORDER — LIDOCAINE VISCOUS 2 % MT SOLN: 5.0000 mL | Freq: Four times a day (QID) | OROMUCOSAL | Status: AC | PRN

## 2015-08-28 MED ORDER — PROMETHAZINE-DM 6.25-15 MG/5ML PO SYRP: 5.0000 mL | ORAL_SOLUTION | Freq: Four times a day (QID) | ORAL | Status: AC | PRN

## 2015-08-28 MED ORDER — LIDOCAINE VISCOUS 2 % MT SOLN
15.0000 mL | Freq: Once | OROMUCOSAL | Status: AC
  Administered 2015-08-28: 15 mL via OROMUCOSAL
  Filled 2015-08-28: qty 15

## 2015-08-28 NOTE — ED Provider Notes (Signed)
[facility] Emergency Department Provider Note  ____________________________________________  Time seen: Approximately 3:13 PM  I have reviewed the triage vital signs and the nursing notes.   HISTORY  Chief Complaint Nasal Congestion; Cough; Sore Throat; and Fever    HPI Conception Lye is a 39 y.o. female patient complained of a sore throat for 8 days. Patient states  cough which is productive in nature for 2 days. She states she is having fever for 2 days which is easily control with taken ibuprofen and Tylenol. Patient stated there is nausea but no vomiting.   Past Medical History  Diagnosis Date  . Kidney stone   . Ovarian cyst   . Bladder infection   . Kidney infection   . Kidney infection   . Mitral valve prolapse     Patient Active Problem List   Diagnosis Date Noted  . SOB (shortness of breath) 06/28/2015  . Palpitations 06/28/2015    Past Surgical History  Procedure Laterality Date  . Abdominal surgery    . Knee surgery    . Tubal ligation      Current Outpatient Rx  Name  Route  Sig  Dispense  Refill  . esomeprazole (NEXIUM) 40 MG capsule   Oral   Take 40 mg by mouth daily at 12 noon.         . naproxen (EC NAPROSYN) 500 MG EC tablet   Oral   Take 1 tablet (500 mg total) by mouth 2 (two) times daily with a meal. Patient taking differently: Take 500 mg by mouth as needed.    60 tablet   0   . ondansetron (ZOFRAN) 4 MG tablet   Oral   Take 1 tablet (4 mg total) by mouth every 8 (eight) hours as needed for nausea or vomiting.   10 tablet   1     Allergies Omnipaque; Augmentin; Biaxin; and Keflex  Family History  Problem Relation Age of Onset  . Family history unknown: Yes    Social History Social History  Substance Use Topics  . Smoking status: Never Smoker   . Smokeless tobacco: None  . Alcohol Use: No    Review of Systems Constitutional: No fever/chills Eyes: No visual changes. ENT: No sore  throat. Cardiovascular: Denies chest pain. Respiratory: Denies shortness of breath. Coughing Gastrointestinal: No abdominal pain.  Nausea without vomiting.   diarrhea.  No constipation. Genitourinary: Negative for dysuria. Musculoskeletal: Negative for back pain. Skin: Negative for rash. Neurological: Negative for headaches, focal weakness or numbness. 10-point ROS otherwise negative.  ____________________________________________   PHYSICAL EXAM:  VITAL SIGNS: ED Triage Vitals  Enc Vitals Group     BP --      Pulse Rate 08/28/15 1447 73     Resp 08/28/15 1447 20     Temp 08/28/15 1447 98 F (36.7 C)     Temp Source 08/28/15 1447 Oral     SpO2 08/28/15 1447 99 %     Weight 08/28/15 1447 190 lb (86.183 kg)     Height 08/28/15 1447  (1.651 m)     Head Cir --      Peak Flow --      Pain Score --      Pain Loc --      Pain Edu? --      Excl. in GC? --    Constitutional: Alert and oriented. Mild distress. Eyes: Conjunctivae are normal. PERRL. EOMI. Head: Atraumatic. Nose: No congestion/rhinnorhea. Mouth/Throat: Mucous membranes  are moist.  Oropharynx erythematous.  Neck: No stridor.  No cervical spine tenderness to palpation. Hematological/Lymphatic/Immunilogical: No cervical lymphadenopathy. Cardiovascular: Normal rate, regular rhythm. Grossly normal heart sounds.  Good peripheral circulation. Respiratory: Normal respiratory effort.  No retractions. Lungs CTAB. Productive cough Gastrointestinal: Soft and nontender. No distention. No abdominal bruits. No CVA tenderness. Musculoskeletal: No lower extremity tenderness nor edema.  No joint effusions. Neurologic:  Normal speech and language. No gross focal neurologic deficits are appreciated. No gait instability. Skin:  Skin is warm, dry and intact. No rash noted. Psychiatric: Mood and affect are normal. Speech and behavior are normal.  ____________________________________________   LABS (all labs ordered are listed,  but only abnormal results are displayed)  Labs Reviewed  POCT RAPID STREP A   ____________________________________________  EKG   ____________________________________________  RADIOLOGY   ____________________________________________   PROCEDURES  Procedure(s) performed: None  Critical Care performed: No  ____________________________________________   INITIAL IMPRESSION / ASSESSMENT AND PLAN / ED COURSE  Pertinent labs & imaging results that were available during my care of the patient were reviewed by me and considered in my medical decision making (see chart for details).  Viral pharyngitis. Discussed negative rapid strep and advised patient culture is pending. Patient also upper respiratory infection. Patient given home care instructions and advised to mix Phenergan DM and viscous lidocaine for swish and swallow. Patient also advised to continue taking ibuprofen and Tylenol for fever.. ____________________________________________   FINAL CLINICAL IMPRESSION(S) / ED DIAGNOSES  Final diagnoses:  Viral pharyngitis  Upper respiratory infection      Joni ReiningRonald K Zoriana Oats, PA-C 08/28/15 1538  Jeanmarie PlantJames A McShane, MD 08/28/15 2027

## 2015-08-28 NOTE — ED Notes (Signed)
Pt presents to ER with headache and sore throat since last Friday . Since Sunday has increased coughing with sore throat; coughs up green , yellow mucus at times. Pt is currently wearing face mask. Pt has fever 102 this morning and has taken ibuprofen and tylenol since. Admits to nausea denies vomiting.

## 2015-08-28 NOTE — Discharge Instructions (Signed)
Cough, Adult °A cough helps to clear your throat and lungs. A cough may last only 2-3 weeks (acute), or it may last longer than 8 weeks (chronic). Many different things can cause a cough. A cough may be a sign of an illness or another medical condition. °HOME CARE °· Pay attention to any changes in your cough. °· Take medicines only as told by your doctor. °¨ If you were prescribed an antibiotic medicine, take it as told by your doctor. Do not stop taking it even if you start to feel better. °¨ Talk with your doctor before you try using a cough medicine. °· Drink enough fluid to keep your pee (urine) clear or pale yellow. °· If the air is dry, use a cold steam vaporizer or humidifier in your home. °· Stay away from things that make you cough at work or at home. °· If your cough is worse at night, try using extra pillows to raise your head up higher while you sleep. °· Do not smoke, and try not to be around smoke. If you need help quitting, ask your doctor. °· Do not have caffeine. °· Do not drink alcohol. °· Rest as needed. °GET HELP IF: °· You have new problems (symptoms). °· You cough up yellow fluid (pus). °· Your cough does not get better after 2-3 weeks, or your cough gets worse. °· Medicine does not help your cough and you are not sleeping well. °· You have pain that gets worse or pain that is not helped with medicine. °· You have a fever. °· You are losing weight and you do not know why. °· You have night sweats. °GET HELP RIGHT AWAY IF: °· You cough up blood. °· You have trouble breathing. °· Your heartbeat is very fast. °  °This information is not intended to replace advice given to you by your health care provider. Make sure you discuss any questions you have with your health care provider. °  °Document Released: 05/24/2011 Document Revised: 06/01/2015 Document Reviewed: 11/17/2014 °Elsevier Interactive Patient Education ©2016 [employer] ° °Pharyngitis °Pharyngitis is a sore throat (pharynx). There is  redness, pain, and swelling of your throat. °HOME CARE  °· Drink enough fluids to keep your pee (urine) clear or pale yellow. °· Only take medicine as told by your doctor. °¨ You may get sick again if you do not take medicine as told. Finish your medicines, even if you start to feel better. °¨ Do not take aspirin. °· Rest. °· Rinse your mouth (gargle) with salt water (½ tsp of salt per 1 qt of water) every 1-2 hours. This will help the pain. °· If you are not at risk for choking, you can suck on hard candy or sore throat lozenges. °GET HELP IF: °· You have large, tender lumps on your neck. °· You have a rash. °· You cough up green, yellow-brown, or bloody spit. °GET HELP RIGHT AWAY IF:  °· You have a stiff neck. °· You drool or cannot swallow liquids. °· You throw up (vomit) or are not able to keep medicine or liquids down. °· You have very bad pain that does not go away with medicine. °· You have problems breathing (not from a stuffy nose). °MAKE SURE YOU:  °· Understand these instructions. °· Will watch your condition. °· Will get help right away if you are not doing well or get worse. °  °This information is not intended to replace advice given to you by your health care   provider. Make sure you discuss any questions you have with your health care provider.   Document Released: 02/27/2008 Document Revised: 07/01/2013 Document Reviewed: 05/18/2013 Elsevier Interactive Patient Education [employer]2016 [employer]

## 2015-11-23 ENCOUNTER — Ambulatory Visit
Admission: EM | Admit: 2015-11-23 | Discharge: 2015-11-23 | Disposition: A | Discharge status: Home / Self Care | Payer: Medicaid Other | Attending: Family Medicine | Admitting: Family Medicine

## 2015-11-23 DIAGNOSIS — R509 Fever, unspecified: Secondary | ICD-10-CM | POA: Diagnosis present

## 2015-11-23 DIAGNOSIS — B349 Viral infection, unspecified: Secondary | ICD-10-CM | POA: Diagnosis not present

## 2015-11-23 DIAGNOSIS — J029 Acute pharyngitis, unspecified: Secondary | ICD-10-CM | POA: Diagnosis present

## 2015-11-23 HISTORY — DX: Anxiety disorder, unspecified: F41.9

## 2015-11-23 HISTORY — DX: Major depressive disorder, single episode, unspecified: F32.9

## 2015-11-23 HISTORY — DX: Depression, unspecified: F32.A

## 2015-11-23 LAB — RAPID INFLUENZA A&B ANTIGENS (ARMC ONLY)
INFLUENZA A (ARMC): NEGATIVE
INFLUENZA B (ARMC): NEGATIVE

## 2015-11-23 LAB — RAPID STREP SCREEN (MED CTR MEBANE ONLY): Streptococcus, Group A Screen (Direct): NEGATIVE

## 2015-11-23 MED ORDER — OSELTAMIVIR PHOSPHATE 75 MG PO CAPS: 75.0000 mg | ORAL_CAPSULE | Freq: Two times a day (BID) | ORAL | Status: AC

## 2015-11-23 NOTE — Discharge Instructions (Signed)
Take medication as prescribed. Rest. Drink plenty of fluids. Take over the counter tylenol and ibuprofen as needed.   Follow up with your primary care physician this week as needed. Return to Urgent care for new or worsening concerns.    Viral Infections A viral infection can be caused by different types of viruses.Most viral infections are not serious and resolve on their own. However, some infections may cause severe symptoms and may lead to further complications. SYMPTOMS Viruses can frequently cause:  Minor sore throat.  Aches and pains.  Headaches.  Runny nose.  Different types of rashes.  Watery eyes.  Tiredness.  Cough.  Loss of appetite.  Gastrointestinal infections, resulting in nausea, vomiting, and diarrhea. These symptoms do not respond to antibiotics because the infection is not caused by bacteria. However, you might catch a bacterial infection following the viral infection. This is sometimes called a "superinfection." Symptoms of such a bacterial infection may include:  Worsening sore throat with pus and difficulty swallowing.  Swollen neck glands.  Chills and a high or persistent fever.  Severe headache.  Tenderness over the sinuses.  Persistent overall ill feeling (malaise), muscle aches, and tiredness (fatigue).  Persistent cough.  Yellow, green, or brown mucus production with coughing. HOME CARE INSTRUCTIONS   Only take over-the-counter or prescription medicines for pain, discomfort, diarrhea, or fever as directed by your caregiver.  Drink enough water and fluids to keep your urine clear or pale yellow. Sports drinks can provide valuable electrolytes, sugars, and hydration.  Get plenty of rest and maintain proper nutrition. Soups and broths with crackers or rice are fine. SEEK IMMEDIATE MEDICAL CARE IF:   You have severe headaches, shortness of breath, chest pain, neck pain, or an unusual rash.  You have uncontrolled vomiting, diarrhea, or  you are unable to keep down fluids.  You or your child has an oral temperature above 102 F (38.9 C), not controlled by medicine.  Your baby is older than 3 months with a rectal temperature of 102 F (38.9 C) or higher.  Your baby is 23 months old or younger with a rectal temperature of 100.4 F (38 C) or higher. MAKE SURE YOU:   Understand these instructions.  Will watch your condition.  Will get help right away if you are not doing well or get worse.   This information is not intended to replace advice given to you by your health care provider. Make sure you discuss any questions you have with your health care provider.   Document Released: 06/20/2005 Document Revised: 12/03/2011 Document Reviewed: 02/16/2015 Elsevier Interactive Patient Education [employer]

## 2015-11-23 NOTE — ED Notes (Signed)
Patient c/o fever (102.3 degrees last night), headache,sore throat, nasal congestion, body aches, and runny nose which started last night.

## 2015-11-23 NOTE — ED Provider Notes (Signed)
[facility]  ____________________________________________  Time seen: Approximately 5:22 PM  I have reviewed the triage vital signs and the nursing notes.   HISTORY  Chief Complaint Fever and Sore Throat  HPI Suzanne Mitchell is a 40 y.o. female presents for complaints of runny nose, nasal congestion, sore throat, cough, body aches and subjective report of fevers since yesterday. States fever up to 102.3 degrees oral last night. States mild sore throat. Reports continues to eat and drink well. 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 Reports has been taking over the counter tylenol and ibuprofen as needed for fever.   Denies chest pain, shortness of breath, dizziness, abdominal pain, dysuria, neck or back pain.   LMP: 3 weeks ago, denies chance of pregnancy.   Past Medical History  Diagnosis Date  . Kidney stone   . Ovarian cyst   . Bladder infection   . Kidney infection   . Kidney infection   . Mitral valve prolapse   . Anxiety   . Depression     Patient Active Problem List   Diagnosis Date Noted  . SOB (shortness of breath) 06/28/2015  . Palpitations 06/28/2015    Past Surgical History  Procedure Laterality Date  . Abdominal surgery    . Knee surgery    . Tubal ligation    . Cholecystectomy      Current Outpatient Rx  Name  Route  Sig  Dispense  Refill  . cyclobenzaprine (FLEXERIL) 10 MG tablet   Oral   Take 10 mg by mouth 3 (three) times daily as needed for muscle spasms.         Marland Kitchen dicyclomine (BENTYL) 20 MG tablet   Oral   Take 20 mg by mouth every 6 (six) hours.         Marland Kitchen esomeprazole (NEXIUM) 40 MG capsule   Oral   Take 40 mg by mouth daily at 12 noon.         . meloxicam (MOBIC) 15 MG tablet   Oral   Take 15 mg by mouth daily.         . polyethylene glycol powder (GLYCOLAX/MIRALAX) powder   Oral   Take 1 Container by mouth once.         .           .           .           .  promethazine-dextromethorphan (PROMETHAZINE-DM) 6.25-15 MG/5ML syrup   Oral   Take 5 mLs by mouth 4 (four) times daily as needed for cough.   118 mL   0     Allergies Omnipaque; Augmentin; Biaxin; Ivp dye; and Keflex  Family History  Problem Relation Age of Onset  . Family history unknown: Yes    Social History Social History  Substance Use Topics  . Smoking status: Never Smoker   . Smokeless tobacco: None  . Alcohol Use: No    Review of Systems Constitutional: Subjective fevers.  Eyes: No visual changes. ENT: Positive sore throat, runny nose, cough, congestion.  Cardiovascular: Denies chest pain. Respiratory: Denies shortness of breath. Gastrointestinal: No abdominal pain.  No nausea, no vomiting.  No diarrhea.  No constipation. Genitourinary: Negative for dysuria. Musculoskeletal: Negative for back pain. Skin: Negative for rash. Neurological: Negative for headaches, focal weakness or numbness.  10-point ROS otherwise negative.  ____________________________________________   PHYSICAL EXAM:  VITAL SIGNS: ED Triage Vitals  Enc  Vitals Group     BP 11/23/15 1649 127/84 mmHg     Pulse Rate 11/23/15 1649 106 Recheck 92     Resp 11/23/15 1649 19     Temp 11/23/15 1649 98.6 F (37 C)     Temp Source 11/23/15 1649 Oral     SpO2 11/23/15 1649 98 %     Weight 11/23/15 1649 195 lb (88.451 kg)     Height 11/23/15 1649  (1.676 m)     Head Cir --      Peak Flow --      Pain Score 11/23/15 1655 3     Pain Loc --      Pain Edu? --      Excl. in GC? --   Constitutional: Alert and oriented. Well appearing and in no acute distress. Eyes: Conjunctivae are normal. PERRL. EOMI. Head: Atraumatic. No sinus tenderness to palpation. No swelling. No erythema.  Ears: no erythema, normal TMs bilaterally.   Nose:Nasal congestion with clear rhinorrhea  Mouth/Throat: Mucous membranes are moist. Mild pharyngeal erythema. No tonsillar swelling or exudate.  Neck: No stridor.  No  cervical spine tenderness to palpation. Hematological/Lymphatic/Immunilogical: No cervical lymphadenopathy. Cardiovascular: Normal rate, regular rhythm. Grossly normal heart sounds.  Good peripheral circulation. Respiratory: Normal respiratory effort.  No retractions. Lungs CTAB.No wheezes, rales or rhonchi. Good air movement.  Gastrointestinal: Soft and nontender. Normal Bowel sounds. No CVA tenderness. Musculoskeletal: No lower or upper extremity tenderness nor edema. No cervical, thoracic or lumbar tenderness to palpation. Neurologic:  Normal speech and language. No gross focal neurologic deficits are appreciated. No gait instability. Skin:  Skin is warm, dry and intact. No rash noted. Psychiatric: Mood and affect are normal. Speech and behavior are normal.  ____________________________________________   LABS (all labs ordered are listed, but only abnormal results are displayed)  Labs Reviewed  RAPID INFLUENZA A&B ANTIGENS ([facility] ONLY)  RAPID STREP SCREEN (NOT AT [facility])  CULTURE, GROUP A STREP [facility])    INITIAL IMPRESSION / ASSESSMENT AND PLAN / ED COURSE  Pertinent labs & imaging results that were available during my care of the patient were reviewed by me and considered in my medical decision making (see chart for details).  Very well-appearing patient. No acute distress. Presents for complaints of runny nose, nasal congestion, sore throat, body aches and subjective report of fever since yesterday. Reports continues to eat and drink well. Reports has been taking over-the-counter Tylenol or ibuprofen as needed for fever. Reports several sick contacts at work. Lungs clear throughout. Abdomen soft and nontender. Moist mucous membranes. Quick strep negative, will culture. Influenza negative.  Discussed with patient suspect viral infection such as influenza. Discussed treatment with oral Tamiflu as suspect influenza though negative flu swab. 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 Patient states that she would like to be treated with Tamiflu.Discussed indication, risks and benefits of medications with patient. Encouraged rest, fluids, over-the-counter Tylenol and ibuprofen as needed for pain or fever. Encourage PCP follow. Work note given for today and tomorrow.  Discussed follow up with Primary care physician this week. Discussed follow up and return parameters including no resolution or any worsening concerns. Patient verbalized understanding and agreed to plan.   ____________________________________________   FINAL CLINICAL IMPRESSION(S) / ED DIAGNOSES  Final diagnoses:  Viral illness      Note: This dictation was prepared with Dragon dictation along with smaller phrase technology. Any transcriptional errors that result from this process are unintentional.  Renford Dills, NP 11/23/15 1757  Renford Dills, NP 11/23/15 1759

## 2015-11-25 LAB — CULTURE, GROUP A STREP (THRC)

## 2017-07-15 ENCOUNTER — Other Ambulatory Visit: Payer: Self-pay | Admitting: Internal Medicine

## 2017-07-15 DIAGNOSIS — M549 Dorsalgia, unspecified: Secondary | ICD-10-CM

## 2017-07-25 ENCOUNTER — Inpatient Hospital Stay
Admission: RE | Admit: 2017-07-25 | Discharge: 2017-07-25 | Disposition: A | Discharge status: Home / Self Care | Payer: Self-pay | Source: Ambulatory Visit | Attending: Internal Medicine | Admitting: Internal Medicine

## 2017-08-01 ENCOUNTER — Emergency Department
Admission: EM | Admit: 2017-08-01 | Discharge: 2017-08-01 | Disposition: A | Discharge status: Home / Self Care | Payer: Self-pay | Attending: Emergency Medicine | Admitting: Emergency Medicine

## 2017-08-01 ENCOUNTER — Other Ambulatory Visit: Payer: Self-pay

## 2017-08-01 ENCOUNTER — Encounter: Payer: Self-pay | Admitting: Emergency Medicine

## 2017-08-01 ENCOUNTER — Emergency Department: Payer: Self-pay

## 2017-08-01 DIAGNOSIS — R079 Chest pain, unspecified: Secondary | ICD-10-CM | POA: Insufficient documentation

## 2017-08-01 LAB — BASIC METABOLIC PANEL
ANION GAP: 8 (ref 5–15)
BUN: 9 mg/dL (ref 6–20)
CO2: 22 mmol/L (ref 22–32)
Calcium: 9.4 mg/dL (ref 8.9–10.3)
Chloride: 105 mmol/L (ref 101–111)
Creatinine, Ser: 0.82 mg/dL (ref 0.44–1.00)
GFR calc Af Amer: >60 mL/min (ref >60)
GFR calc non Af Amer: >60 mL/min (ref >60)
Glucose, Bld: 190 mg/dL — ABNORMAL HIGH (ref 65–99)
POTASSIUM: 3.7 mmol/L (ref 3.5–5.1)
SODIUM: 135 mmol/L (ref 135–145)

## 2017-08-01 LAB — TROPONIN I: Troponin I: <0.03 ng/mL (ref <0.03)

## 2017-08-01 LAB — CBC
HEMATOCRIT: 43.1 % (ref 35.0–47.0)
HEMOGLOBIN: 14.7 g/dL (ref 12.0–16.0)
MCH: 28.6 pg (ref 26.0–34.0)
MCHC: 34.2 g/dL (ref 32.0–36.0)
MCV: 83.5 fL (ref 80.0–100.0)
Platelets: 220 10*3/uL (ref 150–440)
RBC: 5.15 MIL/uL (ref 3.80–5.20)
RDW: 13.9 % (ref 11.5–14.5)
WBC: 7.1 10*3/uL (ref 3.6–11.0)

## 2017-08-01 MED ORDER — LORAZEPAM 1 MG PO TABS: 1.0000 mg | ORAL_TABLET | Freq: Two times a day (BID) | ORAL | 0 refills | Status: AC

## 2017-08-01 NOTE — ED Provider Notes (Signed)
[facility] Emergency Department Provider Note       Time seen: ----------------------------------------- 2:32 PM on 08/01/2017 -----------------------------------------    I have reviewed the triage vital signs and the nursing notes.  HISTORY   Chief Complaint Chest Pain    HPI Suzanne Mitchell is a 41 y.o. female with a history of anxiety and kidney infections who presents to the ED for left-sided chest pain that started on Monday.  She states the pain is intermittent, comes and goes.  Pain is associated with her left arm and shoulder.  She felt dizzy somewhat today.  She reports to being under a lot of stress due to a change in her job.  She has had some shortness of breath but otherwise denies complaints.  She has not felt palpitations associated with this.  Past Medical History:  Diagnosis Date  . Anxiety   . Bladder infection   . Depression   . Kidney infection   . Kidney infection   . Kidney stone   . Mitral valve prolapse   . Ovarian cyst     Patient Active Problem List   Diagnosis Date Noted  . SOB (shortness of breath) 06/28/2015  . Palpitations 06/28/2015    Past Surgical History:  Procedure Laterality Date  . ABDOMINAL SURGERY    . CHOLECYSTECTOMY    . KNEE SURGERY    . TUBAL LIGATION      Allergies Omnipaque [iohexol]; Augmentin [amoxicillin-pot clavulanate]; Biaxin [clarithromycin]; Ivp dye [iodinated diagnostic agents]; and Keflex [cephalexin]  Social History Social History   Tobacco Use  . Smoking status: Never Smoker  . Smokeless tobacco: Never Used  Substance Use Topics  . Alcohol use: No  . Drug use: No    Review of Systems Constitutional: Negative for fever. Eyes: Negative for vision changes ENT:  Negative for congestion, sore throat Cardiovascular: Positive for chest pain Respiratory: Negative for shortness of breath. Gastrointestinal: Negative for abdominal pain, vomiting and diarrhea. Genitourinary:  Negative for dysuria. Musculoskeletal: Negative for back pain. Skin: Negative for rash. Neurological: Negative for headaches, focal weakness or numbness.  All systems negative/normal/unremarkable except as stated in the HPI  ____________________________________________   PHYSICAL EXAM:  VITAL SIGNS: ED Triage Vitals  Enc Vitals Group     BP 08/01/17 1028 (!) 123/57     Pulse Rate 08/01/17 1028 65     Resp 08/01/17 1028 20     Temp 08/01/17 1028 97.7 F (36.5 C)     Temp Source 08/01/17 1028 Oral     SpO2 08/01/17 1028 99 %     Weight 08/01/17 1020 190 lb (86.2 kg)     Height --      Head Circumference --      Peak Flow --      Pain Score 08/01/17 1020 5     Pain Loc --      Pain Edu? --      Excl. in GC? --     Constitutional: Alert and oriented. Well appearing and in no distress. Eyes: Conjunctivae are normal. Normal extraocular movements. ENT   Head: Normocephalic and atraumatic.   Nose: No congestion/rhinnorhea.   Mouth/Throat: Mucous membranes are moist.   Neck: No stridor. Cardiovascular: Normal rate, regular rhythm. No murmurs, rubs, or gallops. Respiratory: Normal respiratory effort without tachypnea nor retractions. Breath sounds are clear and equal bilaterally. No wheezes/rales/rhonchi. Gastrointestinal: Soft and nontender. Normal bowel sounds Musculoskeletal: Nontender with normal range of motion in extremities. No lower extremity tenderness nor  edema. Neurologic:  Normal speech and language. No gross focal neurologic deficits are appreciated.  Skin:  Skin is warm, dry and intact. No rash noted. Psychiatric: Mood and affect are normal. Speech and behavior are normal.  ____________________________________________  EKG: Interpreted by me.  Sinus rhythm the rate is 68 bpm, normal PR interval, normal QRS, normal QT.  ____________________________________________  ED COURSE:  Pertinent labs & imaging results that were available during my care of  the patient were reviewed by me and considered in my medical decision making (see chart for details). Patient presents for chest pain, we will assess with labs and imaging as indicated.   Procedures ____________________________________________   LABS (pertinent positives/negatives)  Labs Reviewed  BASIC METABOLIC PANEL - Abnormal; Notable for the following components:      Result Value   Glucose, Bld 190 (*)    All other components within normal limits  CBC  TROPONIN I    RADIOLOGY  Chest x-ray is unremarkable  ____________________________________________  DIFFERENTIAL DIAGNOSIS   Musculoskeletal pain, anxiety, GERD, SVT, unstable angina  FINAL ASSESSMENT AND PLAN  Chest pain   Plan: Patient had presented for chest pain. Patient's labs were reassuring. Patient's imaging was also reassuring.  Her symptoms are likely multifactorial but are certainly made worse by stress.  I will prescribe a short supply of Ativan, have advised her to take a baby aspirin daily and to follow-up with her doctor.  Emily FilbertWilliams, Jonathan E, MD   Note: This note was generated in part or whole with voice recognition software. Voice recognition is usually quite accurate but there are transcription errors that can and very often do occur. I apologize for any typographical errors that were not detected and corrected.     Emily FilbertWilliams, Jonathan E, MD 08/01/17 1435

## 2017-08-01 NOTE — ED Triage Notes (Signed)
Pt presents with left sided chest pain started on Monday, comes and goes. Pt with hx of SVT.

## 2017-08-01 NOTE — ED Notes (Signed)
EDP at bedside  

## 2018-03-26 ENCOUNTER — Other Ambulatory Visit: Payer: Self-pay

## 2018-03-26 ENCOUNTER — Encounter: Payer: Self-pay | Admitting: Emergency Medicine

## 2018-03-26 ENCOUNTER — Emergency Department
Admission: EM | Admit: 2018-03-26 | Discharge: 2018-03-26 | Disposition: A | Discharge status: Home / Self Care | Payer: Medicaid Other | Attending: Emergency Medicine | Admitting: Emergency Medicine

## 2018-03-26 DIAGNOSIS — Z79899 Other long term (current) drug therapy: Secondary | ICD-10-CM | POA: Diagnosis not present

## 2018-03-26 DIAGNOSIS — L42 Pityriasis rosea: Secondary | ICD-10-CM | POA: Diagnosis not present

## 2018-03-26 DIAGNOSIS — R21 Rash and other nonspecific skin eruption: Secondary | ICD-10-CM | POA: Diagnosis not present

## 2018-03-26 LAB — CBC
HCT: 39.8 % (ref 35.0–47.0)
HEMOGLOBIN: 13.7 g/dL (ref 12.0–16.0)
MCH: 29.5 pg (ref 26.0–34.0)
MCHC: 34.6 g/dL (ref 32.0–36.0)
MCV: 85.3 fL (ref 80.0–100.0)
Platelets: 241 10*3/uL (ref 150–440)
RBC: 4.66 MIL/uL (ref 3.80–5.20)
RDW: 13 % (ref 11.5–14.5)
WBC: 6.6 10*3/uL (ref 3.6–11.0)

## 2018-03-26 LAB — COMPREHENSIVE METABOLIC PANEL
ALK PHOS: 66 U/L (ref 38–126)
ALT: 16 U/L (ref 0–44)
AST: 19 U/L (ref 15–41)
Albumin: 4 g/dL (ref 3.5–5.0)
Anion gap: 7 (ref 5–15)
BUN: 5 mg/dL — ABNORMAL LOW (ref 6–20)
CALCIUM: 8.8 mg/dL — AB (ref 8.9–10.3)
CO2: 24 mmol/L (ref 22–32)
CREATININE: 0.66 mg/dL (ref 0.44–1.00)
Chloride: 107 mmol/L (ref 98–111)
Glucose, Bld: 117 mg/dL — ABNORMAL HIGH (ref 70–99)
Potassium: 3.5 mmol/L (ref 3.5–5.1)
SODIUM: 138 mmol/L (ref 135–145)
TOTAL PROTEIN: 7.5 g/dL (ref 6.5–8.1)
Total Bilirubin: 0.7 mg/dL (ref 0.3–1.2)

## 2018-03-26 MED ORDER — PREDNISONE 10 MG PO TABS: ORAL_TABLET | ORAL | 0 refills | Status: DC

## 2018-03-26 MED ORDER — METHYLPREDNISOLONE SODIUM SUCC 125 MG IJ SOLR
125.0000 mg | Freq: Once | INTRAMUSCULAR | Status: AC
  Administered 2018-03-26: 125 mg via INTRAMUSCULAR
  Filled 2018-03-26: qty 2

## 2018-03-26 NOTE — ED Triage Notes (Signed)
Pt with rash x 2 weeks that started on her feet and has spread up legs, across torso and to face. Pt has scratchy throat since yesterday. Pt seen at pcp Monday, given meds and rash has continued to spread.

## 2018-03-26 NOTE — ED Triage Notes (Signed)
This RN documenting under incorrect patient.

## 2018-03-26 NOTE — ED Notes (Signed)
See triage note  Presents with rash to both feet/legs  States rash is spreading  Was seen by PCP on Monday  But feels worse

## 2018-03-26 NOTE — ED Provider Notes (Signed)
[facility] Emergency Department Provider Note  ____________________________________________  Time seen: Approximately 4:10 PM  I have reviewed the triage vital signs and the nursing notes.   HISTORY  Chief Complaint Rash    HPI Suzanne Mitchell is a 42 y.o. female that presents emergency department for evaluation of rash for 2 weeks.  Patient states that rash started on her feet and is now spreading to her abdomen and neck.  Rash itches.  She states that rash looks like razor burn but keeps spreading.  She was treated for ringworm.  She was evaluated by her primary care 3 days ago and was given ivermectin, triamcinolone without relief.  No one else has a rash.  No tick bites.  No fever, chills, nausea, vomiting.   Past Medical History:  Diagnosis Date  . Anxiety   . Bladder infection   . Depression   . Kidney infection   . Kidney infection   . Kidney stone   . Mitral valve prolapse   . Ovarian cyst     Patient Active Problem List   Diagnosis Date Noted  . SOB (shortness of breath) 06/28/2015  . Palpitations 06/28/2015    Past Surgical History:  Procedure Laterality Date  . ABDOMINAL SURGERY    . CHOLECYSTECTOMY    . KNEE SURGERY    . TUBAL LIGATION      Prior to Admission medications   Medication Sig Start Date End Date Taking? Authorizing Provider  cyclobenzaprine (FLEXERIL) 10 MG tablet Take 10 mg by mouth 3 (three) times daily as needed for muscle spasms.    [provider]  dicyclomine (BENTYL) 20 MG tablet Take 20 mg by mouth every 6 (six) hours.    [provider]  esomeprazole (NEXIUM) 40 MG capsule Take 40 mg by mouth daily at 12 noon.    [provider]  lidocaine (XYLOCAINE) 2 % solution Use as directed 5 mLs in the mouth or throat every 6 (six) hours as needed for mouth pain. 08/28/15   Joni ReiningSmith, Ronald K, PA-C  LORazepam (ATIVAN) 1 MG tablet Take 1 tablet (1 mg total) 2 (two) times daily by mouth. 08/01/17 08/01/18   Emily FilbertWilliams, Jonathan E, MD  meloxicam (MOBIC) 15 MG tablet Take 15 mg by mouth daily.    [provider]  naproxen (EC NAPROSYN) 500 MG EC tablet Take 1 tablet (500 mg total) by mouth 2 (two) times daily with a meal. Patient taking differently: Take 500 mg by mouth as needed.  05/24/15   Beers, Charmayne Sheerharles M, PA-C  ondansetron (ZOFRAN) 4 MG tablet Take 1 tablet (4 mg total) by mouth every 8 (eight) hours as needed for nausea or vomiting. 03/10/15   Governor RooksLord, Rebecca, MD  oseltamivir (TAMIFLU) 75 MG capsule Take 1 capsule (75 mg total) by mouth every 12 (twelve) hours. 11/23/15   Renford DillsMiller, Lindsey, NP  polyethylene glycol powder (GLYCOLAX/MIRALAX) powder Take 1 Container by mouth once.    [provider]  predniSONE (DELTASONE) 10 MG tablet Take 6 tablets on day 1, take 5 tablets on day 2, take 4 tablets on day 3, take 3 tablets on day 4, take 2 tablets on day 5, take 1 tablet on day 6 03/26/18   Enid DerryWagner, Jozee Hammer, PA-C  promethazine-dextromethorphan (PROMETHAZINE-DM) 6.25-15 MG/5ML syrup Take 5 mLs by mouth 4 (four) times daily as needed for cough. 08/28/15   Joni ReiningSmith, Ronald K, PA-C    Allergies Omnipaque [iohexol]; Augmentin [amoxicillin-pot clavulanate]; Biaxin [clarithromycin]; Ivp dye [iodinated diagnostic agents]; and  Keflex [cephalexin]  Family History  Family history unknown: Yes    Social History Social History   Tobacco Use  . Smoking status: Never Smoker  . Smokeless tobacco: Never Used  Substance Use Topics  . Alcohol use: No  . Drug use: No     Review of Systems  Constitutional: No fever/chills Cardiovascular: No chest pain. Respiratory:  No SOB. Gastrointestinal: No abdominal pain.  No nausea, no vomiting.  Musculoskeletal: Negative for musculoskeletal pain. Skin: Negative for abrasions, lacerations, ecchymosis. Positive for rash.  Neurological: Negative for headaches, numbness or tingling   ____________________________________________   PHYSICAL EXAM:  VITAL  SIGNS: ED Triage Vitals  Enc Vitals Group     BP 03/26/18 1459 128/77     Pulse Rate 03/26/18 1459 (!) 55     Resp --      Temp 03/26/18 1459 98.3 F (36.8 C)     Temp Source 03/26/18 1459 Oral     SpO2 03/26/18 1459 97 %     Weight 03/26/18 1501 195 lb (88.5 kg)     Height 03/26/18 1501 5\' 5"  (1.651 m)     Head Circumference --      Peak Flow --      Pain Score 03/26/18 1500 0     Pain Loc --      Pain Edu? --      Excl. in GC? --      Constitutional: Alert and oriented. Well appearing and in no acute distress. Eyes: Conjunctivae are normal. PERRL. EOMI. Head: Atraumatic. ENT:      Ears:      Nose: No congestion/rhinnorhea.      Mouth/Throat: Mucous membranes are moist.  Neck: No stridor.   Cardiovascular: Normal rate, regular rhythm.  Good peripheral circulation. Respiratory: Normal respiratory effort without tachypnea or retractions. Lungs CTAB. Good air entry to the bases with no decreased or absent breath sounds. Musculoskeletal: Full range of motion to all extremities. No gross deformities appreciated.   Neurologic:  Normal speech and language. No gross focal neurologic deficits are appreciated.  Skin:  Skin is warm, dry.  2 cm round scaly patch with depressed center and raised border to left calf.  Red 1 mm papules scattered to bilateral legs,  chest and stomach.   ____________________________________________   LABS (all labs ordered are listed, but only abnormal results are displayed)  Labs Reviewed  COMPREHENSIVE METABOLIC PANEL - Abnormal; Notable for the following components:      Result Value   Glucose, Bld 117 (*)    BUN 5 (*)    Calcium 8.8 (*)    All other components within normal limits  CBC   ____________________________________________  EKG   ____________________________________________  RADIOLOGY   No results found.  ____________________________________________    PROCEDURES  Procedure(s) performed:     Procedures    Medications  methylPREDNISolone sodium succinate (SOLU-MEDROL) 125 mg/2 mL injection 125 mg (has no administration in time range)     ____________________________________________   INITIAL IMPRESSION / ASSESSMENT AND PLAN / ED COURSE  Pertinent labs & imaging results that were available during my care of the patient were reviewed by me and considered in my medical decision making (see chart for details).  Review of the [place] CSRS was performed in accordance of the [facility] prior to dispensing any controlled drugs.   Patient presented to the emergency department for evaluation of rash for 2 weeks.  Vital signs and exam are reassuring.  Appearance is consistent with pityriasis.  IM Solu-Medrol was given for itching and inflammation.  Patient will be discharged home with prescriptions for prednisone. Patient is to follow up with PCP as directed. Patient is given ED precautions to return to the ED for any worsening or new symptoms.     ____________________________________________  FINAL CLINICAL IMPRESSION(S) / ED DIAGNOSES  Final diagnoses:  Rash and nonspecific skin eruption  Pityriasis rosea      NEW MEDICATIONS STARTED DURING THIS VISIT:  ED Discharge Orders        Ordered    predniSONE (DELTASONE) 10 MG tablet     03/26/18 1712          This chart was dictated using voice recognition software/Dragon. Despite best efforts to proofread, errors can occur which can change the meaning. Any change was purely unintentional.    Enid Derry, PA-C 03/26/18 1834    Emily Filbert, MD 03/28/18 1455

## 2018-11-09 ENCOUNTER — Emergency Department: Payer: Medicaid Other

## 2018-11-09 ENCOUNTER — Encounter: Payer: Self-pay | Admitting: Emergency Medicine

## 2018-11-09 ENCOUNTER — Emergency Department
Admission: EM | Admit: 2018-11-09 | Discharge: 2018-11-09 | Disposition: A | Discharge status: Home / Self Care | Payer: Medicaid Other | Attending: Student in an Organized Health Care Education/Training Program | Admitting: Student in an Organized Health Care Education/Training Program

## 2018-11-09 ENCOUNTER — Other Ambulatory Visit: Payer: Self-pay

## 2018-11-09 DIAGNOSIS — Z79899 Other long term (current) drug therapy: Secondary | ICD-10-CM | POA: Insufficient documentation

## 2018-11-09 DIAGNOSIS — J209 Acute bronchitis, unspecified: Secondary | ICD-10-CM | POA: Diagnosis not present

## 2018-11-09 DIAGNOSIS — R05 Cough: Secondary | ICD-10-CM | POA: Diagnosis present

## 2018-11-09 MED ORDER — PREDNISONE 50 MG PO TABS: ORAL_TABLET | ORAL | 0 refills | Status: AC

## 2018-11-09 MED ORDER — AZITHROMYCIN 250 MG PO TABS: ORAL_TABLET | ORAL | 0 refills | Status: AC

## 2018-11-09 MED ORDER — BENZONATATE 100 MG PO CAPS: 100.0000 mg | ORAL_CAPSULE | Freq: Three times a day (TID) | ORAL | 0 refills | Status: AC | PRN

## 2018-11-09 NOTE — ED Triage Notes (Signed)
Pt arrives POV to triage with cough x 1 week. Pt states that her cough seems to be getting worse at this time. Pt is in NAD.

## 2018-11-09 NOTE — ED Provider Notes (Signed)
[facility] Emergency Department Provider Note  ____________________________________________  Time seen: Approximately 9:04 PM  I have reviewed the triage vital signs and the nursing notes.   HISTORY  Chief Complaint Cough    HPI Suzanne Mitchell is a 43 y.o. female presents to the emergency department with productive cough for green sputum production for approximately 1 week.  Patient reports that her cough seems to be worsening.  Cough is persistent throughout the day and is keeping patient from doing her activities of daily living.  Patient denies current fever, shortness of breath or fatigue.  Patient symptoms originally started with nasal congestion and rhinorrhea which have since improved.  No chest tightness or chest pain.  No alleviating measures have been attempted.   Past Medical History:  Diagnosis Date  . Anxiety   . Bladder infection   . Depression   . Kidney infection   . Kidney infection   . Kidney stone   . Mitral valve prolapse   . Ovarian cyst     Patient Active Problem List   Diagnosis Date Noted  . SOB (shortness of breath) 06/28/2015  . Palpitations 06/28/2015    Past Surgical History:  Procedure Laterality Date  . ABDOMINAL SURGERY    . CHOLECYSTECTOMY    . KNEE SURGERY    . TUBAL LIGATION      Prior to Admission medications   Medication Sig Start Date End Date Taking? Authorizing Provider  azithromycin (ZITHROMAX Z-PAK) 250 MG tablet Take 2 tablets (500 mg) on  Day 1,  followed by 1 tablet (250 mg) once daily on Days 2 through 5. 11/09/18 11/14/18  Orvil FeilWoods, Jaclyn M, PA-C  benzonatate (TESSALON PERLES) 100 MG capsule Take 1 capsule (100 mg total) by mouth 3 (three) times daily as needed for up to 7 days for cough. 11/09/18 11/16/18  Orvil FeilWoods, Jaclyn M, PA-C  cyclobenzaprine (FLEXERIL) 10 MG tablet Take 10 mg by mouth 3 (three) times daily as needed for muscle spasms.    [provider]  dicyclomine (BENTYL) 20 MG tablet Take  20 mg by mouth every 6 (six) hours.    [provider]  esomeprazole (NEXIUM) 40 MG capsule Take 40 mg by mouth daily at 12 noon.    [provider]  lidocaine (XYLOCAINE) 2 % solution Use as directed 5 mLs in the mouth or throat every 6 (six) hours as needed for mouth pain. 08/28/15   Joni ReiningSmith, Ronald K, PA-C  meloxicam (MOBIC) 15 MG tablet Take 15 mg by mouth daily.    [provider]  naproxen (EC NAPROSYN) 500 MG EC tablet Take 1 tablet (500 mg total) by mouth 2 (two) times daily with a meal. Patient taking differently: Take 500 mg by mouth as needed.  05/24/15   Beers, Charmayne Sheerharles M, PA-C  ondansetron (ZOFRAN) 4 MG tablet Take 1 tablet (4 mg total) by mouth every 8 (eight) hours as needed for nausea or vomiting. 03/10/15   Governor RooksLord, Rebecca, MD  oseltamivir (TAMIFLU) 75 MG capsule Take 1 capsule (75 mg total) by mouth every 12 (twelve) hours. 11/23/15   Renford DillsMiller, Lindsey, NP  polyethylene glycol powder (GLYCOLAX/MIRALAX) powder Take 1 Container by mouth once.    [provider]  predniSONE (DELTASONE) 50 MG tablet Take one 50 mg tablet once daily for the next five days. 11/09/18   Orvil FeilWoods, Jaclyn M, PA-C  promethazine-dextromethorphan (PROMETHAZINE-DM) 6.25-15 MG/5ML syrup Take 5 mLs by mouth 4 (four) times daily as needed for cough. 08/28/15  Joni Reining, PA-C    Allergies Omnipaque [iohexol]; Augmentin [amoxicillin-pot clavulanate]; Biaxin [clarithromycin]; Ivp dye [iodinated diagnostic agents]; and Keflex [cephalexin]  Family History  Family history unknown: Yes    Social History Social History   Tobacco Use  . Smoking status: Never Smoker  . Smokeless tobacco: Never Used  Substance Use Topics  . Alcohol use: No  . Drug use: No     Review of Systems  Constitutional: No fever/chills Eyes: No visual changes. No discharge ENT: No upper respiratory complaints. Cardiovascular: no chest pain. Respiratory: Patient has cough. No SOB. Gastrointestinal: No  abdominal pain.  No nausea, no vomiting.  No diarrhea.  No constipation. Genitourinary: Negative for dysuria. No hematuria Musculoskeletal: Negative for musculoskeletal pain. Skin: Negative for rash, abrasions, lacerations, ecchymosis. Neurological: Negative for headaches, focal weakness or numbness.  ____________________________________________   PHYSICAL EXAM:  VITAL SIGNS: ED Triage Vitals  Enc Vitals Group     BP 11/09/18 1937 140/80     Pulse Rate 11/09/18 1937 100     Resp 11/09/18 1937 18     Temp 11/09/18 1937 98.6 F (37 C)     Temp Source 11/09/18 1937 Oral     SpO2 11/09/18 1935 98 %     Weight 11/09/18 1938 200 lb (90.7 kg)     Height 11/09/18 1938 5\' 5"  (1.651 m)     Head Circumference --      Peak Flow --      Pain Score 11/09/18 1938 5     Pain Loc --      Pain Edu? --      Excl. in GC? --      Constitutional: Alert and oriented. Well appearing and in no acute distress. Eyes: Conjunctivae are normal. PERRL. EOMI. Head: Atraumatic. ENT:      Ears: TMs are pearly.      Nose: No congestion/rhinnorhea.      Mouth/Throat: Mucous membranes are moist.  Neck: No stridor.  No cervical spine tenderness to palpation. Hematological/Lymphatic/Immunilogical: No cervical lymphadenopathy. Cardiovascular: Normal rate, regular rhythm. Normal S1 and S2.  Good peripheral circulation. Respiratory: Normal respiratory effort without tachypnea or retractions. Lungs CTAB. Good air entry to the bases with no decreased or absent breath sounds. Skin:  Skin is warm, dry and intact. No rash noted. Psychiatric: Mood and affect are normal. Speech and behavior are normal. Patient exhibits appropriate insight and judgement.   ____________________________________________   LABS (all labs ordered are listed, but only abnormal results are displayed)  Labs Reviewed - No data to  display ____________________________________________  EKG   ____________________________________________  RADIOLOGY I personally viewed and evaluated these images as part of my medical decision making, as well as reviewing the written report by the radiologist.  Dg Chest 2 View  Result Date: 11/09/2018 CLINICAL DATA:  43 year old female with cough for 1 week. Shortness of breath and chest pain. EXAM: CHEST - 2 VIEW COMPARISON:  Chest radiographs 08/01/2017 and earlier. FINDINGS: Lung volumes and mediastinal contours remain normal. Visualized tracheal air column is within normal limits. No pneumothorax, pulmonary edema, pleural effusion or confluent pulmonary opacity. Increased bowel gas at the splenic flexure, but no dilated loops are evident. No acute osseous abnormality identified. IMPRESSION: No acute cardiopulmonary abnormality. Electronically Signed   By: Odessa Fleming M.D.   On: 11/09/2018 20:26    ____________________________________________    PROCEDURES  Procedure(s) performed:    Procedures    Medications - No data to display   ____________________________________________  INITIAL IMPRESSION / ASSESSMENT AND PLAN / ED COURSE  Pertinent labs & imaging results that were available during my care of the patient were reviewed by me and considered in my medical decision making (see chart for details).  Review of the [place] CSRS was performed in accordance of the [facility] prior to dispensing any controlled drugs.      Assessment and plan Acute bronchitis Patient presents to the emergency department with productive cough for green sputum production for approximately 1 week.  Chest x-ray reveals no consolidations, opacities or infiltrates.  Vital signs were reassuring throughout emergency department course.  History and physical exam findings are consistent with acute bronchitis.  Patient was treated with azithromycin and prednisone.  Patient was advised to follow-up with primary  care as needed.  All patient questions were answered.    ____________________________________________  FINAL CLINICAL IMPRESSION(S) / ED DIAGNOSES  Final diagnoses:  Acute bronchitis, unspecified organism      NEW MEDICATIONS STARTED DURING THIS VISIT:  ED Discharge Orders         Ordered    azithromycin (ZITHROMAX Z-PAK) 250 MG tablet     11/09/18 2059    predniSONE (DELTASONE) 50 MG tablet     11/09/18 2059    benzonatate (TESSALON PERLES) 100 MG capsule  3 times daily PRN     11/09/18 2059              This chart was dictated using voice recognition software/Dragon. Despite best efforts to proofread, errors can occur which can change the meaning. Any change was purely unintentional.    Gasper Lloyd 11/09/18 2106    Willy Eddy, MD 11/09/18 2300

## 2019-04-15 NOTE — Progress Notes (Unsigned)
Back pain into left leg for 2 years
